# Patient Record
Sex: Female | Born: 1940 | Race: Black or African American | Hispanic: No | Marital: Single | Smoking: Former smoker
Health system: Southern US, Community
[De-identification: ages and names within clinical notes are randomized; demographics above are authoritative.]

## PROBLEM LIST (undated history)

## (undated) DIAGNOSIS — E7800 Pure hypercholesterolemia, unspecified: Secondary | ICD-10-CM

## (undated) DIAGNOSIS — I10 Essential (primary) hypertension: Secondary | ICD-10-CM

## (undated) HISTORY — PX: TUMOR REMOVAL: SHX12

## (undated) HISTORY — PX: ABDOMINAL HYSTERECTOMY: SHX81

---

## 2004-06-13 ENCOUNTER — Ambulatory Visit: Payer: Self-pay | Admitting: Family Medicine

## 2005-07-03 ENCOUNTER — Ambulatory Visit: Payer: Self-pay | Admitting: Family Medicine

## 2006-07-11 ENCOUNTER — Ambulatory Visit: Payer: Self-pay | Admitting: Family Medicine

## 2007-07-18 ENCOUNTER — Ambulatory Visit: Payer: Self-pay | Admitting: Family Medicine

## 2007-07-29 ENCOUNTER — Ambulatory Visit: Payer: Self-pay | Admitting: Family Medicine

## 2007-08-29 ENCOUNTER — Ambulatory Visit: Payer: Self-pay | Admitting: Surgery

## 2008-11-10 ENCOUNTER — Ambulatory Visit: Payer: Self-pay | Admitting: Family Medicine

## 2009-10-06 ENCOUNTER — Ambulatory Visit: Payer: Self-pay | Admitting: Cardiology

## 2009-11-15 ENCOUNTER — Ambulatory Visit: Payer: Self-pay | Admitting: Family Medicine

## 2010-11-30 ENCOUNTER — Ambulatory Visit: Payer: Self-pay | Admitting: Family Medicine

## 2011-01-23 ENCOUNTER — Ambulatory Visit: Payer: Self-pay | Admitting: Family Medicine

## 2011-11-11 ENCOUNTER — Emergency Department: Payer: Self-pay | Admitting: Unknown Physician Specialty

## 2011-12-13 ENCOUNTER — Ambulatory Visit: Payer: Self-pay | Admitting: Family Medicine

## 2012-12-16 ENCOUNTER — Ambulatory Visit: Payer: Self-pay | Admitting: Family Medicine

## 2014-01-02 ENCOUNTER — Ambulatory Visit: Payer: Self-pay | Admitting: Family Medicine

## 2014-06-19 DIAGNOSIS — E780 Pure hypercholesterolemia: Secondary | ICD-10-CM | POA: Diagnosis not present

## 2014-06-19 DIAGNOSIS — I517 Cardiomegaly: Secondary | ICD-10-CM | POA: Diagnosis not present

## 2014-06-19 DIAGNOSIS — I10 Essential (primary) hypertension: Secondary | ICD-10-CM | POA: Diagnosis not present

## 2014-06-19 DIAGNOSIS — R7309 Other abnormal glucose: Secondary | ICD-10-CM | POA: Diagnosis not present

## 2014-06-24 DIAGNOSIS — I10 Essential (primary) hypertension: Secondary | ICD-10-CM | POA: Diagnosis not present

## 2014-06-24 DIAGNOSIS — H524 Presbyopia: Secondary | ICD-10-CM | POA: Diagnosis not present

## 2014-06-24 DIAGNOSIS — R7309 Other abnormal glucose: Secondary | ICD-10-CM | POA: Diagnosis not present

## 2014-06-24 DIAGNOSIS — E780 Pure hypercholesterolemia: Secondary | ICD-10-CM | POA: Diagnosis not present

## 2014-06-24 DIAGNOSIS — H5210 Myopia, unspecified eye: Secondary | ICD-10-CM | POA: Diagnosis not present

## 2014-06-29 DIAGNOSIS — I517 Cardiomegaly: Secondary | ICD-10-CM | POA: Diagnosis not present

## 2015-01-07 ENCOUNTER — Other Ambulatory Visit: Payer: Self-pay | Admitting: Family Medicine

## 2015-01-07 DIAGNOSIS — Z0000 Encounter for general adult medical examination without abnormal findings: Secondary | ICD-10-CM | POA: Diagnosis not present

## 2015-01-07 DIAGNOSIS — Z1231 Encounter for screening mammogram for malignant neoplasm of breast: Secondary | ICD-10-CM

## 2015-01-15 ENCOUNTER — Other Ambulatory Visit: Payer: Self-pay | Admitting: Family Medicine

## 2015-01-15 ENCOUNTER — Ambulatory Visit
Admission: RE | Admit: 2015-01-15 | Discharge: 2015-01-15 | Disposition: A | Discharge status: Home / Self Care | Payer: Commercial Managed Care - HMO | Source: Ambulatory Visit | Attending: Family Medicine | Admitting: Family Medicine

## 2015-01-15 DIAGNOSIS — Z1231 Encounter for screening mammogram for malignant neoplasm of breast: Secondary | ICD-10-CM | POA: Insufficient documentation

## 2015-03-22 DIAGNOSIS — Z0000 Encounter for general adult medical examination without abnormal findings: Secondary | ICD-10-CM | POA: Diagnosis not present

## 2015-03-29 DIAGNOSIS — M79671 Pain in right foot: Secondary | ICD-10-CM | POA: Diagnosis not present

## 2015-03-29 DIAGNOSIS — E7800 Pure hypercholesterolemia, unspecified: Secondary | ICD-10-CM | POA: Diagnosis not present

## 2015-03-29 DIAGNOSIS — E119 Type 2 diabetes mellitus without complications: Secondary | ICD-10-CM | POA: Diagnosis not present

## 2015-03-29 DIAGNOSIS — I10 Essential (primary) hypertension: Secondary | ICD-10-CM | POA: Diagnosis not present

## 2015-07-22 DIAGNOSIS — E7800 Pure hypercholesterolemia, unspecified: Secondary | ICD-10-CM | POA: Diagnosis not present

## 2015-07-22 DIAGNOSIS — I10 Essential (primary) hypertension: Secondary | ICD-10-CM | POA: Diagnosis not present

## 2015-07-22 DIAGNOSIS — E119 Type 2 diabetes mellitus without complications: Secondary | ICD-10-CM | POA: Diagnosis not present

## 2015-07-29 DIAGNOSIS — E7800 Pure hypercholesterolemia, unspecified: Secondary | ICD-10-CM | POA: Diagnosis not present

## 2015-07-29 DIAGNOSIS — E119 Type 2 diabetes mellitus without complications: Secondary | ICD-10-CM | POA: Diagnosis not present

## 2015-07-29 DIAGNOSIS — I10 Essential (primary) hypertension: Secondary | ICD-10-CM | POA: Diagnosis not present

## 2015-08-23 DIAGNOSIS — H524 Presbyopia: Secondary | ICD-10-CM | POA: Diagnosis not present

## 2015-08-23 DIAGNOSIS — H5210 Myopia, unspecified eye: Secondary | ICD-10-CM | POA: Diagnosis not present

## 2015-10-21 DIAGNOSIS — E7800 Pure hypercholesterolemia, unspecified: Secondary | ICD-10-CM | POA: Diagnosis not present

## 2015-10-21 DIAGNOSIS — E119 Type 2 diabetes mellitus without complications: Secondary | ICD-10-CM | POA: Diagnosis not present

## 2015-10-21 DIAGNOSIS — I10 Essential (primary) hypertension: Secondary | ICD-10-CM | POA: Diagnosis not present

## 2015-11-23 DIAGNOSIS — E6609 Other obesity due to excess calories: Secondary | ICD-10-CM | POA: Diagnosis not present

## 2015-11-23 DIAGNOSIS — I10 Essential (primary) hypertension: Secondary | ICD-10-CM | POA: Diagnosis not present

## 2015-11-23 DIAGNOSIS — E7800 Pure hypercholesterolemia, unspecified: Secondary | ICD-10-CM | POA: Diagnosis not present

## 2015-11-23 DIAGNOSIS — E119 Type 2 diabetes mellitus without complications: Secondary | ICD-10-CM | POA: Diagnosis not present

## 2016-02-08 ENCOUNTER — Other Ambulatory Visit: Payer: Self-pay | Admitting: Family Medicine

## 2016-02-08 DIAGNOSIS — Z1231 Encounter for screening mammogram for malignant neoplasm of breast: Secondary | ICD-10-CM

## 2016-03-15 DIAGNOSIS — E7800 Pure hypercholesterolemia, unspecified: Secondary | ICD-10-CM | POA: Diagnosis not present

## 2016-03-15 DIAGNOSIS — E119 Type 2 diabetes mellitus without complications: Secondary | ICD-10-CM | POA: Diagnosis not present

## 2016-03-15 DIAGNOSIS — I10 Essential (primary) hypertension: Secondary | ICD-10-CM | POA: Diagnosis not present

## 2016-03-22 ENCOUNTER — Ambulatory Visit
Admission: RE | Admit: 2016-03-22 | Discharge: 2016-03-22 | Disposition: A | Discharge status: Home / Self Care | Payer: Commercial Managed Care - HMO | Source: Ambulatory Visit | Attending: Family Medicine | Admitting: Family Medicine

## 2016-03-22 DIAGNOSIS — Z1231 Encounter for screening mammogram for malignant neoplasm of breast: Secondary | ICD-10-CM | POA: Insufficient documentation

## 2016-03-22 DIAGNOSIS — E119 Type 2 diabetes mellitus without complications: Secondary | ICD-10-CM | POA: Diagnosis not present

## 2016-03-22 DIAGNOSIS — Z0000 Encounter for general adult medical examination without abnormal findings: Secondary | ICD-10-CM | POA: Diagnosis not present

## 2016-03-22 DIAGNOSIS — E7800 Pure hypercholesterolemia, unspecified: Secondary | ICD-10-CM | POA: Diagnosis not present

## 2016-03-22 DIAGNOSIS — Z23 Encounter for immunization: Secondary | ICD-10-CM | POA: Diagnosis not present

## 2016-03-22 DIAGNOSIS — R001 Bradycardia, unspecified: Secondary | ICD-10-CM | POA: Diagnosis not present

## 2016-03-22 DIAGNOSIS — I10 Essential (primary) hypertension: Secondary | ICD-10-CM | POA: Diagnosis not present

## 2016-03-22 DIAGNOSIS — E6609 Other obesity due to excess calories: Secondary | ICD-10-CM | POA: Diagnosis not present

## 2016-07-20 DIAGNOSIS — E6609 Other obesity due to excess calories: Secondary | ICD-10-CM | POA: Diagnosis not present

## 2016-07-20 DIAGNOSIS — E7800 Pure hypercholesterolemia, unspecified: Secondary | ICD-10-CM | POA: Diagnosis not present

## 2016-07-20 DIAGNOSIS — I10 Essential (primary) hypertension: Secondary | ICD-10-CM | POA: Diagnosis not present

## 2016-07-20 DIAGNOSIS — E119 Type 2 diabetes mellitus without complications: Secondary | ICD-10-CM | POA: Diagnosis not present

## 2016-07-24 DIAGNOSIS — I10 Essential (primary) hypertension: Secondary | ICD-10-CM | POA: Diagnosis not present

## 2016-07-24 DIAGNOSIS — E119 Type 2 diabetes mellitus without complications: Secondary | ICD-10-CM | POA: Diagnosis not present

## 2016-07-24 DIAGNOSIS — E7800 Pure hypercholesterolemia, unspecified: Secondary | ICD-10-CM | POA: Diagnosis not present

## 2016-11-23 DIAGNOSIS — E7800 Pure hypercholesterolemia, unspecified: Secondary | ICD-10-CM | POA: Diagnosis not present

## 2016-11-23 DIAGNOSIS — E119 Type 2 diabetes mellitus without complications: Secondary | ICD-10-CM | POA: Diagnosis not present

## 2016-11-23 DIAGNOSIS — E6609 Other obesity due to excess calories: Secondary | ICD-10-CM | POA: Diagnosis not present

## 2016-11-23 DIAGNOSIS — I10 Essential (primary) hypertension: Secondary | ICD-10-CM | POA: Diagnosis not present

## 2016-11-27 ENCOUNTER — Emergency Department
Admission: EM | Admit: 2016-11-27 | Discharge: 2016-11-27 | Disposition: A | Discharge status: Home / Self Care | Payer: Medicare HMO | Attending: Emergency Medicine | Admitting: Emergency Medicine

## 2016-11-27 ENCOUNTER — Emergency Department: Payer: Medicare HMO

## 2016-11-27 ENCOUNTER — Encounter: Payer: Self-pay | Admitting: Emergency Medicine

## 2016-11-27 DIAGNOSIS — Z87891 Personal history of nicotine dependence: Secondary | ICD-10-CM | POA: Diagnosis not present

## 2016-11-27 DIAGNOSIS — I10 Essential (primary) hypertension: Secondary | ICD-10-CM | POA: Diagnosis not present

## 2016-11-27 DIAGNOSIS — M25562 Pain in left knee: Secondary | ICD-10-CM | POA: Diagnosis not present

## 2016-11-27 DIAGNOSIS — Z7982 Long term (current) use of aspirin: Secondary | ICD-10-CM | POA: Diagnosis not present

## 2016-11-27 DIAGNOSIS — Z79899 Other long term (current) drug therapy: Secondary | ICD-10-CM | POA: Insufficient documentation

## 2016-11-27 DIAGNOSIS — M1712 Unilateral primary osteoarthritis, left knee: Secondary | ICD-10-CM | POA: Insufficient documentation

## 2016-11-27 HISTORY — DX: Essential (primary) hypertension: I10

## 2016-11-27 HISTORY — DX: Pure hypercholesterolemia, unspecified: E78.00

## 2016-11-27 MED ORDER — TRAMADOL HCL 50 MG PO TABS: 50.0000 mg | ORAL_TABLET | Freq: Three times a day (TID) | ORAL | 0 refills | Status: AC | PRN

## 2016-11-27 MED ORDER — TRAMADOL HCL 50 MG PO TABS
50.0000 mg | ORAL_TABLET | Freq: Once | ORAL | Status: AC
  Administered 2016-11-27: 50 mg via ORAL
  Filled 2016-11-27: qty 1

## 2016-11-27 NOTE — ED Notes (Signed)
See triage note  States she felt a pop to left this am   States she twisted her knee   Having pain to lateral aspect on knee  Min swelling

## 2016-11-27 NOTE — ED Provider Notes (Signed)
[facility] Emergency Department Provider Note  ____________________________________________   First MD Initiated Contact with Patient 11/27/16 [phone]     (approximate)  I have reviewed the triage vital signs and the nursing notes.   HISTORY  Chief Complaint Knee Pain   HPI Danielle Hunt is a 76 y.o. female is here with complaint of left knee pain. Patient states she was locking her door this morning when she turned and felt her knee pop. She states she's been told in the past that she had arthritis. She takes no arthritis medication. She frequently wears a elastic knee brace. She has not been seen by orthopedics for her knee problems. Currently she rates her pain as 8 out of 10.   Past Medical History:  Diagnosis Date  . Hypercholesteremia   . Hypertension     There are no active problems to display for this patient.   Past Surgical History:  Procedure Laterality Date  . ABDOMINAL HYSTERECTOMY    . TUMOR REMOVAL      Prior to Admission medications   Medication Sig Start Date End Date Taking? Authorizing Provider  aspirin EC 81 MG tablet Take 81 mg by mouth daily.   Yes [provider]  atorvastatin (LIPITOR) 40 MG tablet Take 40 mg by mouth daily.   Yes [provider]  hydrochlorothiazide (HYDRODIURIL) 25 MG tablet Take 25 mg by mouth daily.   Yes [provider]  losartan (COZAAR) 100 MG tablet Take 100 mg by mouth daily.   Yes [provider]  traMADol (ULTRAM) 50 MG tablet Take 1 tablet (50 mg total) by mouth every 8 (eight) hours as needed. 11/27/16   Tommi Rumps, PA-C    Allergies Patient has no known allergies.  History reviewed. No pertinent family history.  Social History Social History  Substance Use Topics  . Smoking status: Former Games developer  . Smokeless tobacco: Never Used  . Alcohol use No    Review of Systems Constitutional: No fever/chills Cardiovascular: Denies chest  pain. Respiratory: Denies shortness of breath. Gastrointestinal: No abdominal pain.  No nausea, no vomiting.  Musculoskeletal: Positive left knee pain. Skin: Negative for rash. Neurological: Negative for headaches, focal weakness or numbness. ____________________________________________   PHYSICAL EXAM:  VITAL SIGNS: ED Triage Vitals  Enc Vitals Group     BP 11/27/16 0843 114/68     Pulse Rate 11/27/16 0843 62     Resp 11/27/16 0843 18     Temp 11/27/16 0843 98.4 F (36.9 C)     Temp Source 11/27/16 0843 Oral     SpO2 11/27/16 0843 100 %     Weight 11/27/16 0837 203 lb (92.1 kg)     Height 11/27/16 0837 5\' 3"  (1.6 m)     Head Circumference --      Peak Flow --      Pain Score 11/27/16 0837 8     Pain Loc --      Pain Edu? --      Excl. in GC? --    Constitutional: Alert and oriented. Well appearing and in no acute distress. Eyes: Conjunctivae are normal.  Head: Atraumatic. Neck: No stridor.   Cardiovascular: Normal rate, regular rhythm. Grossly normal heart sounds.  Good peripheral circulation. Respiratory: Normal respiratory effort.  No retractions. Lungs CTAB. Musculoskeletal: Left knee without gross deformity however there is arthritic changes noted. Crepitus with range of motion. There is generalized tenderness on palpation. No erythema, ecchymosis or abrasions were noted.  Neurologic:  Normal speech and language. No gross focal neurologic deficits are appreciated.  Skin:  Skin is warm, dry and intact. No rash noted. Psychiatric: Mood and affect are normal. Speech and behavior are normal.  ____________________________________________   LABS (all labs ordered are listed, but only abnormal results are displayed)  Labs Reviewed - No data to display   RADIOLOGY  Dg Knee Complete 4 Views Left  Result Date: 11/27/2016 CLINICAL DATA:  Left knee pain. EXAM: LEFT KNEE - COMPLETE 4+ VIEW COMPARISON:  11/11/2011 FINDINGS: No joint effusion. Moderate tricompartment joint  space narrowing is identified. There is also try compartment marginal spur formation. Mild chondrocalcinosis noted. IMPRESSION: 1. No acute findings. 2. Osteoarthritis with chondrocalcinosis. Electronically Signed   By: Signa Kell M.D.   On: 11/27/2016 09:10    ____________________________________________   PROCEDURES  Procedure(s) performed: None  Procedures  Critical Care performed: No  ____________________________________________   INITIAL IMPRESSION / ASSESSMENT AND PLAN / ED COURSE  Pertinent labs & imaging results that were available during my care of the patient were reviewed by me and considered in my medical decision making (see chart for details).  Patient was placed in knee immobilizer as she felt her knee was unstable. She was also given a walker to use with walking. Because of her pain at this time she was given tramadol 50 mg in the ED and a prescription to continue one every 8 hours as needed. Family is aware that this medication could cause drowsiness and increase her chances of following as well as the patient. She is follow-up with her PCP if any continued problems with her knee.   ___________________________________________   FINAL CLINICAL IMPRESSION(S) / ED DIAGNOSES  Final diagnoses:  Acute pain of left knee  Osteoarthritis of left knee, unspecified osteoarthritis type      NEW MEDICATIONS STARTED DURING THIS VISIT:  Discharge Medication List as of 11/27/2016  9:50 AM    START taking these medications   Details  traMADol (ULTRAM) 50 MG tablet Take 1 tablet (50 mg total) by mouth every 8 (eight) hours as needed., Starting Mon 11/27/2016, Print         Note:  This document was prepared using Dragon voice recognition software and may include unintentional dictation errors.    Tommi Rumps, PA-C 11/27/16 1711    Merrily Brittle, MD 11/28/16 [phone]

## 2016-11-27 NOTE — ED Triage Notes (Signed)
Pt c/o left knee pain. Reports was locking door this morning and turned to quick and something in knee popped. No deformity noted.

## 2016-11-27 NOTE — Discharge Instructions (Signed)
Follow-up with your primary care doctor if any continued problems with your knee. Take tramadol 50 mg every 8 hours as needed for knee pain. Be aware that this medication could cause drowsiness increase your risk for falling. Wear knee immobilizer for added support at this time. When walking use the  walker for extra support.

## 2016-12-08 DIAGNOSIS — M170 Bilateral primary osteoarthritis of knee: Secondary | ICD-10-CM | POA: Diagnosis not present

## 2017-01-03 DIAGNOSIS — M25561 Pain in right knee: Secondary | ICD-10-CM | POA: Diagnosis not present

## 2017-01-03 DIAGNOSIS — M25562 Pain in left knee: Secondary | ICD-10-CM | POA: Diagnosis not present

## 2017-01-03 DIAGNOSIS — M170 Bilateral primary osteoarthritis of knee: Secondary | ICD-10-CM | POA: Diagnosis not present

## 2017-02-13 ENCOUNTER — Other Ambulatory Visit: Payer: Self-pay | Admitting: Family Medicine

## 2017-02-13 DIAGNOSIS — Z1231 Encounter for screening mammogram for malignant neoplasm of breast: Secondary | ICD-10-CM

## 2017-02-26 DIAGNOSIS — H524 Presbyopia: Secondary | ICD-10-CM | POA: Diagnosis not present

## 2017-03-23 ENCOUNTER — Ambulatory Visit
Admission: RE | Admit: 2017-03-23 | Discharge: 2017-03-23 | Disposition: A | Discharge status: Home / Self Care | Payer: Medicare HMO | Source: Ambulatory Visit | Attending: Family Medicine | Admitting: Family Medicine

## 2017-03-23 DIAGNOSIS — Z1231 Encounter for screening mammogram for malignant neoplasm of breast: Secondary | ICD-10-CM | POA: Diagnosis not present

## 2017-04-04 DIAGNOSIS — I10 Essential (primary) hypertension: Secondary | ICD-10-CM | POA: Diagnosis not present

## 2017-04-04 DIAGNOSIS — E7800 Pure hypercholesterolemia, unspecified: Secondary | ICD-10-CM | POA: Diagnosis not present

## 2017-04-04 DIAGNOSIS — E6609 Other obesity due to excess calories: Secondary | ICD-10-CM | POA: Diagnosis not present

## 2017-04-04 DIAGNOSIS — E119 Type 2 diabetes mellitus without complications: Secondary | ICD-10-CM | POA: Diagnosis not present

## 2017-04-04 DIAGNOSIS — Z0000 Encounter for general adult medical examination without abnormal findings: Secondary | ICD-10-CM | POA: Diagnosis not present

## 2017-04-04 DIAGNOSIS — Z23 Encounter for immunization: Secondary | ICD-10-CM | POA: Diagnosis not present

## 2017-04-06 DIAGNOSIS — E119 Type 2 diabetes mellitus without complications: Secondary | ICD-10-CM | POA: Diagnosis not present

## 2017-04-06 DIAGNOSIS — E7800 Pure hypercholesterolemia, unspecified: Secondary | ICD-10-CM | POA: Diagnosis not present

## 2017-04-06 DIAGNOSIS — I10 Essential (primary) hypertension: Secondary | ICD-10-CM | POA: Diagnosis not present

## 2017-06-20 DIAGNOSIS — M170 Bilateral primary osteoarthritis of knee: Secondary | ICD-10-CM | POA: Diagnosis not present

## 2017-06-28 DIAGNOSIS — H401132 Primary open-angle glaucoma, bilateral, moderate stage: Secondary | ICD-10-CM | POA: Diagnosis not present

## 2017-07-30 DIAGNOSIS — E119 Type 2 diabetes mellitus without complications: Secondary | ICD-10-CM | POA: Diagnosis not present

## 2017-07-30 DIAGNOSIS — E6609 Other obesity due to excess calories: Secondary | ICD-10-CM | POA: Diagnosis not present

## 2017-07-30 DIAGNOSIS — E7800 Pure hypercholesterolemia, unspecified: Secondary | ICD-10-CM | POA: Diagnosis not present

## 2017-07-30 DIAGNOSIS — J302 Other seasonal allergic rhinitis: Secondary | ICD-10-CM | POA: Diagnosis not present

## 2017-07-30 DIAGNOSIS — I10 Essential (primary) hypertension: Secondary | ICD-10-CM | POA: Diagnosis not present

## 2017-08-02 DIAGNOSIS — E7800 Pure hypercholesterolemia, unspecified: Secondary | ICD-10-CM | POA: Diagnosis not present

## 2017-08-02 DIAGNOSIS — I10 Essential (primary) hypertension: Secondary | ICD-10-CM | POA: Diagnosis not present

## 2017-08-02 DIAGNOSIS — E119 Type 2 diabetes mellitus without complications: Secondary | ICD-10-CM | POA: Diagnosis not present

## 2017-11-29 DIAGNOSIS — E119 Type 2 diabetes mellitus without complications: Secondary | ICD-10-CM | POA: Diagnosis not present

## 2017-11-29 DIAGNOSIS — I10 Essential (primary) hypertension: Secondary | ICD-10-CM | POA: Diagnosis not present

## 2017-11-29 DIAGNOSIS — E7800 Pure hypercholesterolemia, unspecified: Secondary | ICD-10-CM | POA: Diagnosis not present

## 2017-11-29 DIAGNOSIS — E6609 Other obesity due to excess calories: Secondary | ICD-10-CM | POA: Diagnosis not present

## 2018-02-04 DIAGNOSIS — H524 Presbyopia: Secondary | ICD-10-CM | POA: Diagnosis not present

## 2018-02-27 DIAGNOSIS — Z0100 Encounter for examination of eyes and vision without abnormal findings: Secondary | ICD-10-CM | POA: Diagnosis not present

## 2018-03-13 ENCOUNTER — Other Ambulatory Visit: Payer: Self-pay | Admitting: Family Medicine

## 2018-03-13 DIAGNOSIS — Z1231 Encounter for screening mammogram for malignant neoplasm of breast: Secondary | ICD-10-CM

## 2018-04-05 ENCOUNTER — Ambulatory Visit
Admission: RE | Admit: 2018-04-05 | Discharge: 2018-04-05 | Disposition: A | Discharge status: Home / Self Care | Payer: Medicare HMO | Source: Ambulatory Visit | Attending: Family Medicine | Admitting: Family Medicine

## 2018-04-05 DIAGNOSIS — Z1231 Encounter for screening mammogram for malignant neoplasm of breast: Secondary | ICD-10-CM | POA: Diagnosis not present

## 2018-04-05 DIAGNOSIS — I10 Essential (primary) hypertension: Secondary | ICD-10-CM | POA: Diagnosis not present

## 2018-04-05 DIAGNOSIS — E119 Type 2 diabetes mellitus without complications: Secondary | ICD-10-CM | POA: Diagnosis not present

## 2018-04-05 DIAGNOSIS — E7800 Pure hypercholesterolemia, unspecified: Secondary | ICD-10-CM | POA: Diagnosis not present

## 2018-04-05 DIAGNOSIS — N183 Chronic kidney disease, stage 3 (moderate): Secondary | ICD-10-CM | POA: Diagnosis not present

## 2018-04-11 DIAGNOSIS — I10 Essential (primary) hypertension: Secondary | ICD-10-CM | POA: Diagnosis not present

## 2018-04-11 DIAGNOSIS — Z0000 Encounter for general adult medical examination without abnormal findings: Secondary | ICD-10-CM | POA: Diagnosis not present

## 2018-04-11 DIAGNOSIS — E6609 Other obesity due to excess calories: Secondary | ICD-10-CM | POA: Diagnosis not present

## 2018-04-11 DIAGNOSIS — Z8639 Personal history of other endocrine, nutritional and metabolic disease: Secondary | ICD-10-CM | POA: Diagnosis not present

## 2018-04-11 DIAGNOSIS — E7800 Pure hypercholesterolemia, unspecified: Secondary | ICD-10-CM | POA: Diagnosis not present

## 2018-04-11 DIAGNOSIS — N183 Chronic kidney disease, stage 3 (moderate): Secondary | ICD-10-CM | POA: Diagnosis not present

## 2018-04-11 DIAGNOSIS — E1121 Type 2 diabetes mellitus with diabetic nephropathy: Secondary | ICD-10-CM | POA: Diagnosis not present

## 2018-09-17 DIAGNOSIS — Z8639 Personal history of other endocrine, nutritional and metabolic disease: Secondary | ICD-10-CM | POA: Diagnosis not present

## 2018-09-17 DIAGNOSIS — E1121 Type 2 diabetes mellitus with diabetic nephropathy: Secondary | ICD-10-CM | POA: Diagnosis not present

## 2018-09-17 DIAGNOSIS — E7800 Pure hypercholesterolemia, unspecified: Secondary | ICD-10-CM | POA: Diagnosis not present

## 2018-09-17 DIAGNOSIS — N183 Chronic kidney disease, stage 3 (moderate): Secondary | ICD-10-CM | POA: Diagnosis not present

## 2018-09-17 DIAGNOSIS — I10 Essential (primary) hypertension: Secondary | ICD-10-CM | POA: Diagnosis not present

## 2018-09-19 DIAGNOSIS — E213 Hyperparathyroidism, unspecified: Secondary | ICD-10-CM | POA: Diagnosis not present

## 2018-09-24 DIAGNOSIS — Z780 Asymptomatic menopausal state: Secondary | ICD-10-CM | POA: Diagnosis not present

## 2018-09-24 DIAGNOSIS — Z8639 Personal history of other endocrine, nutritional and metabolic disease: Secondary | ICD-10-CM | POA: Diagnosis not present

## 2018-09-24 DIAGNOSIS — E7800 Pure hypercholesterolemia, unspecified: Secondary | ICD-10-CM | POA: Diagnosis not present

## 2018-09-24 DIAGNOSIS — E1121 Type 2 diabetes mellitus with diabetic nephropathy: Secondary | ICD-10-CM | POA: Diagnosis not present

## 2018-09-24 DIAGNOSIS — N183 Chronic kidney disease, stage 3 (moderate): Secondary | ICD-10-CM | POA: Diagnosis not present

## 2018-09-24 DIAGNOSIS — E6609 Other obesity due to excess calories: Secondary | ICD-10-CM | POA: Diagnosis not present

## 2018-09-24 DIAGNOSIS — I10 Essential (primary) hypertension: Secondary | ICD-10-CM | POA: Diagnosis not present

## 2018-10-18 DIAGNOSIS — H401132 Primary open-angle glaucoma, bilateral, moderate stage: Secondary | ICD-10-CM | POA: Diagnosis not present

## 2018-12-26 DIAGNOSIS — E213 Hyperparathyroidism, unspecified: Secondary | ICD-10-CM | POA: Diagnosis not present

## 2018-12-26 DIAGNOSIS — E1121 Type 2 diabetes mellitus with diabetic nephropathy: Secondary | ICD-10-CM | POA: Diagnosis not present

## 2018-12-26 DIAGNOSIS — E559 Vitamin D deficiency, unspecified: Secondary | ICD-10-CM | POA: Diagnosis not present

## 2019-02-13 DIAGNOSIS — H524 Presbyopia: Secondary | ICD-10-CM | POA: Diagnosis not present

## 2019-03-19 DIAGNOSIS — E7800 Pure hypercholesterolemia, unspecified: Secondary | ICD-10-CM | POA: Diagnosis not present

## 2019-03-19 DIAGNOSIS — N183 Chronic kidney disease, stage 3 unspecified: Secondary | ICD-10-CM | POA: Diagnosis not present

## 2019-03-19 DIAGNOSIS — I10 Essential (primary) hypertension: Secondary | ICD-10-CM | POA: Diagnosis not present

## 2019-03-26 DIAGNOSIS — I10 Essential (primary) hypertension: Secondary | ICD-10-CM | POA: Diagnosis not present

## 2019-03-26 DIAGNOSIS — Z6831 Body mass index (BMI) 31.0-31.9, adult: Secondary | ICD-10-CM | POA: Diagnosis not present

## 2019-03-26 DIAGNOSIS — E6609 Other obesity due to excess calories: Secondary | ICD-10-CM | POA: Diagnosis not present

## 2019-03-26 DIAGNOSIS — E7800 Pure hypercholesterolemia, unspecified: Secondary | ICD-10-CM | POA: Diagnosis not present

## 2019-03-26 DIAGNOSIS — Z87891 Personal history of nicotine dependence: Secondary | ICD-10-CM | POA: Diagnosis not present

## 2019-06-12 DIAGNOSIS — H401133 Primary open-angle glaucoma, bilateral, severe stage: Secondary | ICD-10-CM | POA: Diagnosis not present

## 2019-06-12 DIAGNOSIS — I10 Essential (primary) hypertension: Secondary | ICD-10-CM | POA: Diagnosis not present

## 2019-07-03 DIAGNOSIS — E559 Vitamin D deficiency, unspecified: Secondary | ICD-10-CM | POA: Diagnosis not present

## 2019-07-03 DIAGNOSIS — Z01818 Encounter for other preprocedural examination: Secondary | ICD-10-CM | POA: Diagnosis not present

## 2019-07-03 DIAGNOSIS — H401133 Primary open-angle glaucoma, bilateral, severe stage: Secondary | ICD-10-CM | POA: Diagnosis not present

## 2019-07-03 DIAGNOSIS — H2511 Age-related nuclear cataract, right eye: Secondary | ICD-10-CM | POA: Diagnosis not present

## 2019-07-03 DIAGNOSIS — E669 Obesity, unspecified: Secondary | ICD-10-CM | POA: Diagnosis not present

## 2019-07-03 DIAGNOSIS — Z0000 Encounter for general adult medical examination without abnormal findings: Secondary | ICD-10-CM | POA: Diagnosis not present

## 2019-07-03 DIAGNOSIS — E213 Hyperparathyroidism, unspecified: Secondary | ICD-10-CM | POA: Diagnosis not present

## 2019-07-03 DIAGNOSIS — I10 Essential (primary) hypertension: Secondary | ICD-10-CM | POA: Diagnosis not present

## 2019-07-03 DIAGNOSIS — Z87891 Personal history of nicotine dependence: Secondary | ICD-10-CM | POA: Diagnosis not present

## 2019-07-03 DIAGNOSIS — Z6831 Body mass index (BMI) 31.0-31.9, adult: Secondary | ICD-10-CM | POA: Diagnosis not present

## 2019-07-25 DIAGNOSIS — H401113 Primary open-angle glaucoma, right eye, severe stage: Secondary | ICD-10-CM | POA: Diagnosis not present

## 2019-07-25 DIAGNOSIS — H401130 Primary open-angle glaucoma, bilateral, stage unspecified: Secondary | ICD-10-CM | POA: Diagnosis not present

## 2019-07-25 DIAGNOSIS — H2511 Age-related nuclear cataract, right eye: Secondary | ICD-10-CM | POA: Diagnosis not present

## 2019-08-08 DIAGNOSIS — H409 Unspecified glaucoma: Secondary | ICD-10-CM | POA: Diagnosis not present

## 2019-08-08 DIAGNOSIS — H401133 Primary open-angle glaucoma, bilateral, severe stage: Secondary | ICD-10-CM | POA: Diagnosis not present

## 2019-08-08 DIAGNOSIS — H401123 Primary open-angle glaucoma, left eye, severe stage: Secondary | ICD-10-CM | POA: Diagnosis not present

## 2019-08-08 DIAGNOSIS — H2512 Age-related nuclear cataract, left eye: Secondary | ICD-10-CM | POA: Diagnosis not present

## 2019-10-08 DIAGNOSIS — H401133 Primary open-angle glaucoma, bilateral, severe stage: Secondary | ICD-10-CM | POA: Diagnosis not present

## 2019-12-03 DIAGNOSIS — R05 Cough: Secondary | ICD-10-CM | POA: Diagnosis not present

## 2019-12-03 DIAGNOSIS — U071 COVID-19: Secondary | ICD-10-CM | POA: Diagnosis not present

## 2019-12-03 DIAGNOSIS — Z20822 Contact with and (suspected) exposure to covid-19: Secondary | ICD-10-CM | POA: Diagnosis not present

## 2019-12-10 DIAGNOSIS — I10 Essential (primary) hypertension: Secondary | ICD-10-CM | POA: Diagnosis not present

## 2019-12-17 DIAGNOSIS — Z8616 Personal history of COVID-19: Secondary | ICD-10-CM | POA: Diagnosis not present

## 2019-12-17 DIAGNOSIS — E6609 Other obesity due to excess calories: Secondary | ICD-10-CM | POA: Diagnosis not present

## 2019-12-17 DIAGNOSIS — Z136 Encounter for screening for cardiovascular disorders: Secondary | ICD-10-CM | POA: Diagnosis not present

## 2019-12-17 DIAGNOSIS — I10 Essential (primary) hypertension: Secondary | ICD-10-CM | POA: Diagnosis not present

## 2020-01-07 DIAGNOSIS — R739 Hyperglycemia, unspecified: Secondary | ICD-10-CM | POA: Diagnosis not present

## 2020-01-07 DIAGNOSIS — E213 Hyperparathyroidism, unspecified: Secondary | ICD-10-CM | POA: Diagnosis not present

## 2020-01-07 DIAGNOSIS — E1121 Type 2 diabetes mellitus with diabetic nephropathy: Secondary | ICD-10-CM | POA: Diagnosis not present

## 2020-01-07 DIAGNOSIS — E559 Vitamin D deficiency, unspecified: Secondary | ICD-10-CM | POA: Diagnosis not present

## 2020-02-02 IMAGING — MG DIGITAL SCREENING BILATERAL MAMMOGRAM WITH TOMO AND CAD
6 of 10 series · 6 of 30 positions shown · non-contrast
Comparison: Previous exam(s).

CLINICAL DATA: Screening.

EXAM:
DIGITAL SCREENING BILATERAL MAMMOGRAM WITH TOMO AND CAD

[R MLO synth-2D (1 of 2)]
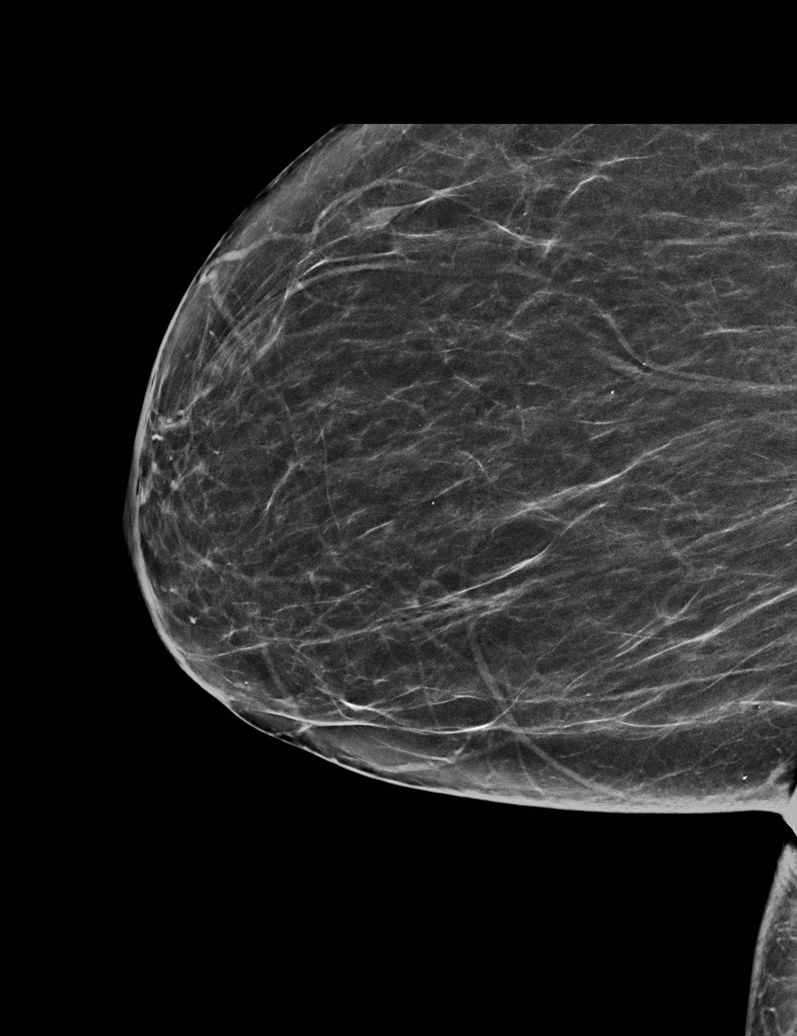

[R CC synth-2D]
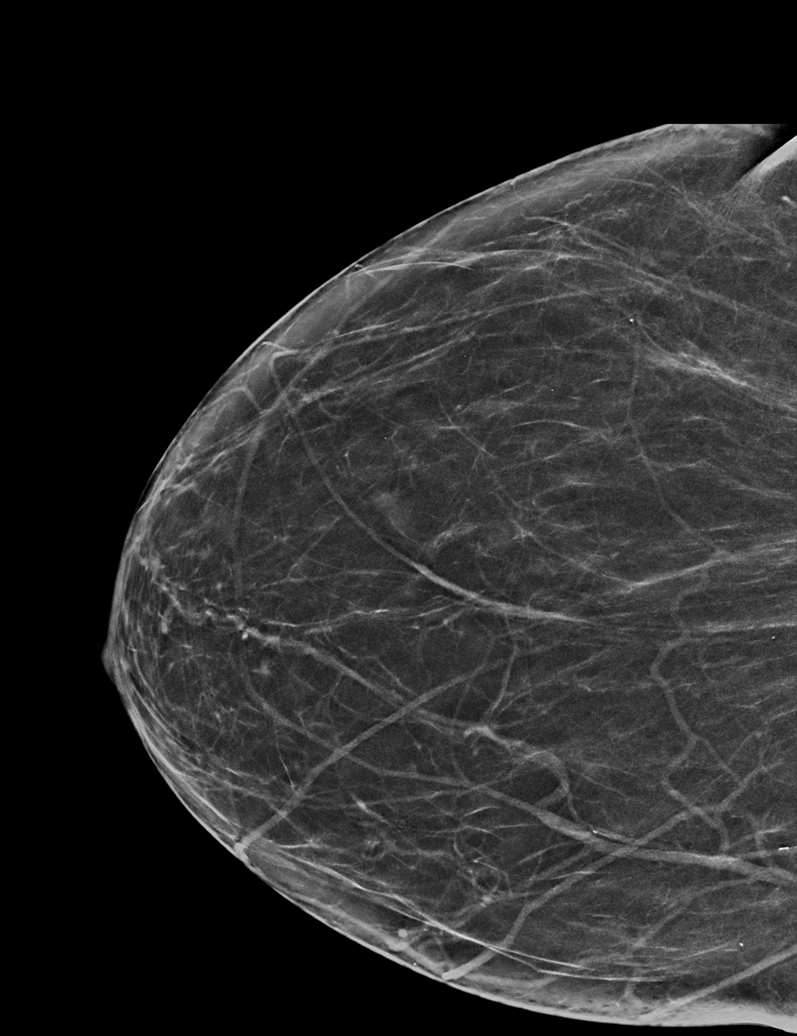

[L CC synth-2D]
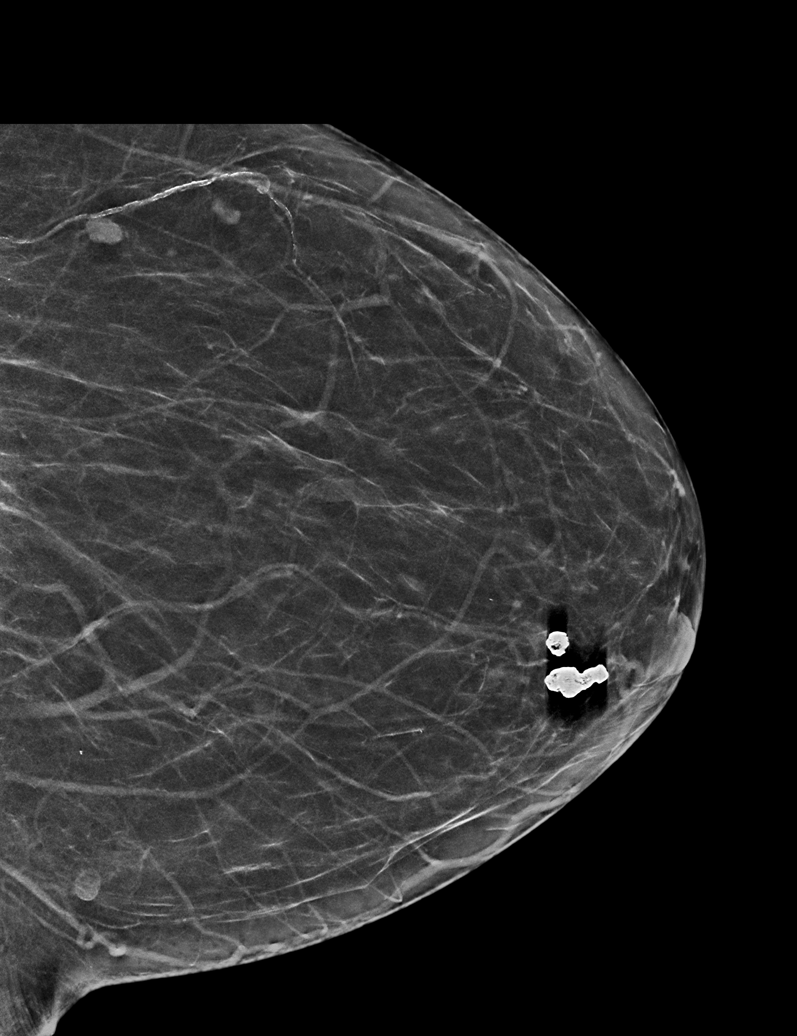

[R MLO synth-2D (2 of 2)]
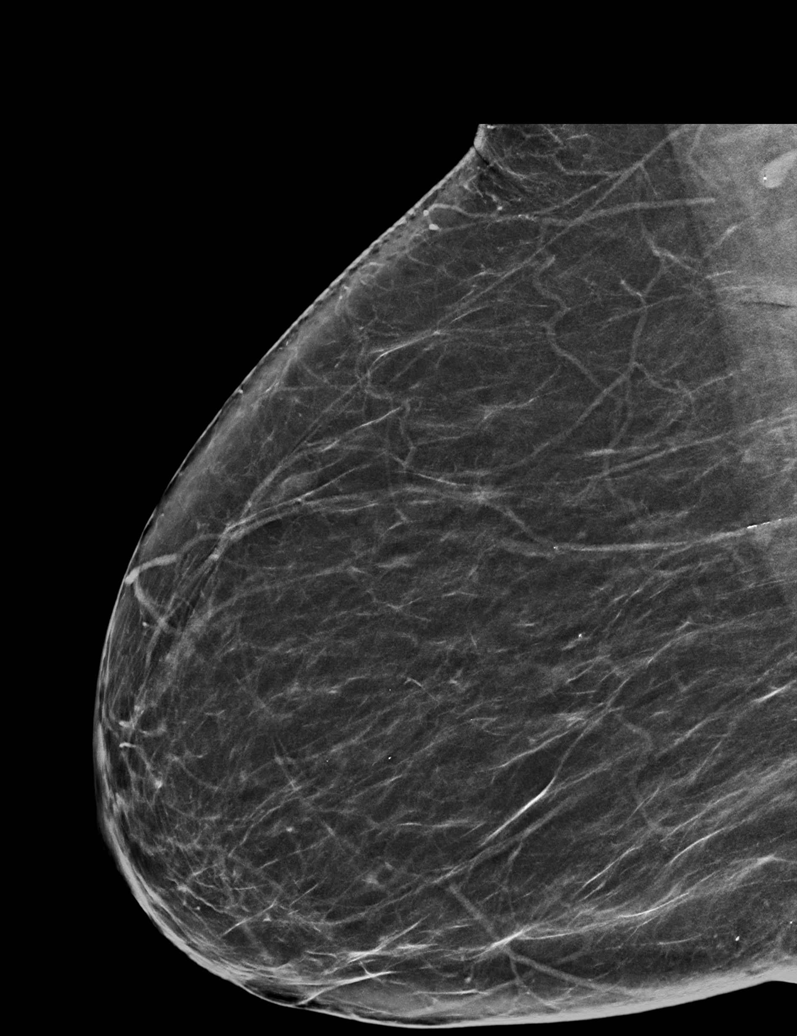

[L MLO synth-2D]
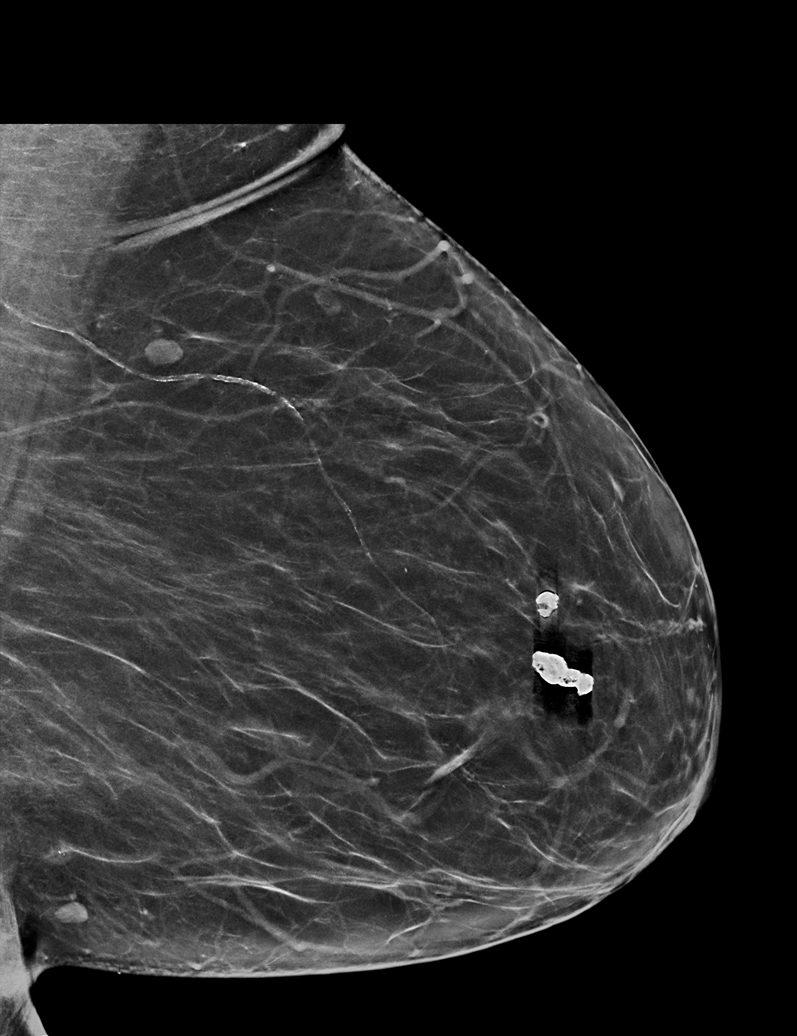

[R CC tomo · tomo slice 29/57.0]
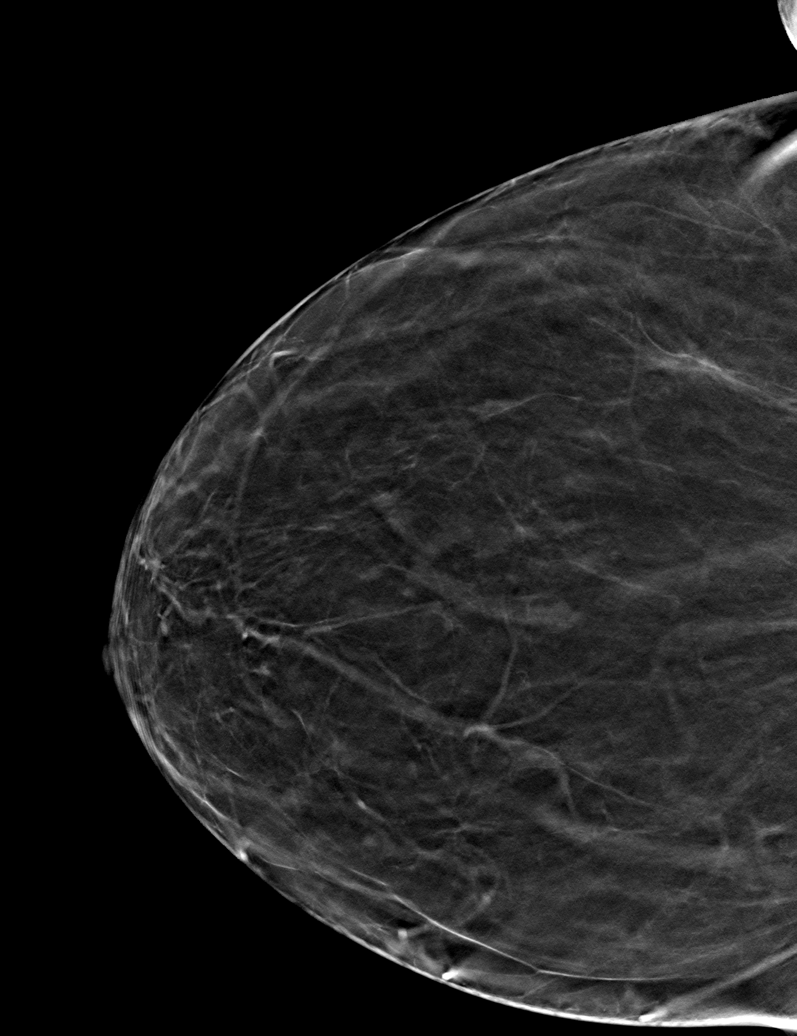

[6 of 30 positions shown; findings below may reference images not displayed]

ACR Breast Density Category b: There are scattered areas of
fibroglandular density.
FINDINGS: There are no findings suspicious for malignancy. Images were
processed with CAD.
IMPRESSION: No mammographic evidence of malignancy. A result letter of this
screening mammogram will be mailed directly to the patient.

RECOMMENDATION:
Screening mammogram in one year. (Code:[id number])

BI-RADS CATEGORY  1: Negative.

## 2020-02-13 DIAGNOSIS — E119 Type 2 diabetes mellitus without complications: Secondary | ICD-10-CM | POA: Diagnosis not present

## 2020-04-15 DIAGNOSIS — H401133 Primary open-angle glaucoma, bilateral, severe stage: Secondary | ICD-10-CM | POA: Diagnosis not present

## 2020-05-20 DIAGNOSIS — H401133 Primary open-angle glaucoma, bilateral, severe stage: Secondary | ICD-10-CM | POA: Diagnosis not present

## 2020-06-03 DIAGNOSIS — M25562 Pain in left knee: Secondary | ICD-10-CM | POA: Diagnosis not present

## 2020-06-03 DIAGNOSIS — M170 Bilateral primary osteoarthritis of knee: Secondary | ICD-10-CM | POA: Diagnosis not present

## 2020-06-03 DIAGNOSIS — M25561 Pain in right knee: Secondary | ICD-10-CM | POA: Diagnosis not present

## 2020-07-02 DIAGNOSIS — Z136 Encounter for screening for cardiovascular disorders: Secondary | ICD-10-CM | POA: Diagnosis not present

## 2020-07-02 DIAGNOSIS — I10 Essential (primary) hypertension: Secondary | ICD-10-CM | POA: Diagnosis not present

## 2020-07-05 DIAGNOSIS — R030 Elevated blood-pressure reading, without diagnosis of hypertension: Secondary | ICD-10-CM | POA: Diagnosis not present

## 2020-07-05 DIAGNOSIS — Z0000 Encounter for general adult medical examination without abnormal findings: Secondary | ICD-10-CM | POA: Diagnosis not present

## 2020-07-05 DIAGNOSIS — E669 Obesity, unspecified: Secondary | ICD-10-CM | POA: Diagnosis not present

## 2020-07-05 DIAGNOSIS — E785 Hyperlipidemia, unspecified: Secondary | ICD-10-CM | POA: Diagnosis not present

## 2020-08-26 DIAGNOSIS — H401133 Primary open-angle glaucoma, bilateral, severe stage: Secondary | ICD-10-CM | POA: Diagnosis not present

## 2020-09-30 DIAGNOSIS — E7800 Pure hypercholesterolemia, unspecified: Secondary | ICD-10-CM | POA: Diagnosis not present

## 2020-10-08 DIAGNOSIS — E6609 Other obesity due to excess calories: Secondary | ICD-10-CM | POA: Diagnosis not present

## 2020-10-08 DIAGNOSIS — E7800 Pure hypercholesterolemia, unspecified: Secondary | ICD-10-CM | POA: Diagnosis not present

## 2020-10-08 DIAGNOSIS — I10 Essential (primary) hypertension: Secondary | ICD-10-CM | POA: Diagnosis not present

## 2020-11-03 DIAGNOSIS — M8588 Other specified disorders of bone density and structure, other site: Secondary | ICD-10-CM | POA: Diagnosis not present

## 2020-11-03 DIAGNOSIS — E559 Vitamin D deficiency, unspecified: Secondary | ICD-10-CM | POA: Diagnosis not present

## 2020-11-03 DIAGNOSIS — E213 Hyperparathyroidism, unspecified: Secondary | ICD-10-CM | POA: Diagnosis not present

## 2020-11-10 DIAGNOSIS — E559 Vitamin D deficiency, unspecified: Secondary | ICD-10-CM | POA: Diagnosis not present

## 2020-11-10 DIAGNOSIS — E213 Hyperparathyroidism, unspecified: Secondary | ICD-10-CM | POA: Diagnosis not present

## 2020-12-17 DIAGNOSIS — H401133 Primary open-angle glaucoma, bilateral, severe stage: Secondary | ICD-10-CM | POA: Diagnosis not present

## 2021-02-14 DIAGNOSIS — I10 Essential (primary) hypertension: Secondary | ICD-10-CM | POA: Diagnosis not present

## 2021-02-14 DIAGNOSIS — M170 Bilateral primary osteoarthritis of knee: Secondary | ICD-10-CM | POA: Diagnosis not present

## 2021-02-28 DIAGNOSIS — H524 Presbyopia: Secondary | ICD-10-CM | POA: Diagnosis not present

## 2023-07-03 DIAGNOSIS — D75839 Thrombocytosis, unspecified: Secondary | ICD-10-CM | POA: Diagnosis not present

## 2023-07-03 DIAGNOSIS — E7800 Pure hypercholesterolemia, unspecified: Secondary | ICD-10-CM | POA: Diagnosis not present

## 2023-07-03 DIAGNOSIS — R7303 Prediabetes: Secondary | ICD-10-CM | POA: Diagnosis not present

## 2023-07-03 DIAGNOSIS — G309 Alzheimer's disease, unspecified: Secondary | ICD-10-CM | POA: Diagnosis not present

## 2023-07-03 DIAGNOSIS — J302 Other seasonal allergic rhinitis: Secondary | ICD-10-CM | POA: Diagnosis not present

## 2023-07-03 DIAGNOSIS — Z0000 Encounter for general adult medical examination without abnormal findings: Secondary | ICD-10-CM | POA: Diagnosis not present

## 2023-07-03 DIAGNOSIS — I10 Essential (primary) hypertension: Secondary | ICD-10-CM | POA: Diagnosis not present

## 2023-07-03 DIAGNOSIS — Z8639 Personal history of other endocrine, nutritional and metabolic disease: Secondary | ICD-10-CM | POA: Diagnosis not present

## 2023-07-03 DIAGNOSIS — F0150 Vascular dementia without behavioral disturbance: Secondary | ICD-10-CM | POA: Diagnosis not present

## 2023-07-03 DIAGNOSIS — F0280 Dementia in other diseases classified elsewhere without behavioral disturbance: Secondary | ICD-10-CM | POA: Diagnosis not present

## 2023-07-17 ENCOUNTER — Telehealth: Payer: Self-pay | Admitting: *Deleted

## 2023-07-17 NOTE — Telephone Encounter (Signed)
 I gave jennifer the daughter phone number [phone] name is annette.she will call daughter.

## 2023-07-19 ENCOUNTER — Encounter: Payer: Self-pay | Admitting: Family Medicine

## 2023-07-27 ENCOUNTER — Inpatient Hospital Stay: Attending: Oncology | Admitting: Oncology

## 2023-07-27 ENCOUNTER — Encounter: Payer: Self-pay | Admitting: Oncology

## 2023-07-27 ENCOUNTER — Inpatient Hospital Stay

## 2023-07-27 VITALS — BP 172/73 | HR 83 | Temp 95.0°F | Resp 18 | Wt 141.8 lb

## 2023-07-27 DIAGNOSIS — R634 Abnormal weight loss: Secondary | ICD-10-CM | POA: Insufficient documentation

## 2023-07-27 DIAGNOSIS — D751 Secondary polycythemia: Secondary | ICD-10-CM | POA: Diagnosis not present

## 2023-07-27 DIAGNOSIS — D72819 Decreased white blood cell count, unspecified: Secondary | ICD-10-CM | POA: Diagnosis not present

## 2023-07-27 DIAGNOSIS — D75839 Thrombocytosis, unspecified: Secondary | ICD-10-CM

## 2023-07-27 DIAGNOSIS — D472 Monoclonal gammopathy: Secondary | ICD-10-CM | POA: Insufficient documentation

## 2023-07-27 DIAGNOSIS — D471 Chronic myeloproliferative disease: Secondary | ICD-10-CM | POA: Insufficient documentation

## 2023-07-27 LAB — COMPREHENSIVE METABOLIC PANEL WITH GFR
ALT: 9 U/L (ref 0–44)
AST: 14 U/L — ABNORMAL LOW (ref 15–41)
Albumin: 3.7 g/dL (ref 3.5–5.0)
Alkaline Phosphatase: 61 U/L (ref 38–126)
Anion gap: 6 (ref 5–15)
BUN: 9 mg/dL (ref 8–23)
CO2: 21 mmol/L — ABNORMAL LOW (ref 22–32)
Calcium: 10.9 mg/dL — ABNORMAL HIGH (ref 8.9–10.3)
Chloride: 113 mmol/L — ABNORMAL HIGH (ref 98–111)
Creatinine, Ser: 0.83 mg/dL (ref 0.44–1.00)
GFR, Estimated: >60 mL/min (ref >60)
Glucose, Bld: 100 mg/dL — ABNORMAL HIGH (ref 70–99)
Potassium: 3.3 mmol/L — ABNORMAL LOW (ref 3.5–5.1)
Sodium: 140 mmol/L (ref 135–145)
Total Bilirubin: 1.2 mg/dL (ref 0.0–1.2)
Total Protein: 7.1 g/dL (ref 6.5–8.1)

## 2023-07-27 LAB — CBC WITH DIFFERENTIAL/PLATELET
Abs Immature Granulocytes: 0.01 10*3/uL (ref 0.00–0.07)
Basophils Absolute: 0.1 10*3/uL (ref 0.0–0.1)
Basophils Relative: 2 %
Eosinophils Absolute: 0.2 10*3/uL (ref 0.0–0.5)
Eosinophils Relative: 5 %
HCT: 46.3 % — ABNORMAL HIGH (ref 36.0–46.0)
Hemoglobin: 14.9 g/dL (ref 12.0–15.0)
Immature Granulocytes: 0 %
Lymphocytes Relative: 25 %
Lymphs Abs: 1 10*3/uL (ref 0.7–4.0)
MCH: 27.9 pg (ref 26.0–34.0)
MCHC: 32.2 g/dL (ref 30.0–36.0)
MCV: 86.7 fL (ref 80.0–100.0)
Monocytes Absolute: 0.4 10*3/uL (ref 0.1–1.0)
Monocytes Relative: 10 %
Neutro Abs: 2.2 10*3/uL (ref 1.7–7.7)
Neutrophils Relative %: 58 %
Platelets: 506 10*3/uL — ABNORMAL HIGH (ref 150–400)
RBC: 5.34 MIL/uL — ABNORMAL HIGH (ref 3.87–5.11)
RDW: 17.2 % — ABNORMAL HIGH (ref 11.5–15.5)
WBC: 3.9 10*3/uL — ABNORMAL LOW (ref 4.0–10.5)
nRBC: 0 % (ref 0.0–0.2)

## 2023-07-27 LAB — TECHNOLOGIST SMEAR REVIEW: Plt Morphology: INCREASED

## 2023-07-27 LAB — HEPATITIS PANEL, ACUTE
HCV Ab: NONREACTIVE
Hep A IgM: NONREACTIVE
Hep B C IgM: NONREACTIVE
Hepatitis B Surface Ag: NONREACTIVE

## 2023-07-27 LAB — TSH: TSH: 1.311 u[IU]/mL (ref 0.350–4.500)

## 2023-07-27 LAB — LACTATE DEHYDROGENASE: LDH: 142 U/L (ref 98–192)

## 2023-07-27 LAB — FOLATE: Folate: 6.8 ng/mL (ref >5.9)

## 2023-07-27 LAB — HIV ANTIBODY (ROUTINE TESTING W REFLEX): HIV Screen 4th Generation wRfx: NONREACTIVE

## 2023-07-27 LAB — VITAMIN B12: Vitamin B-12: 259 pg/mL (ref 180–914)

## 2023-07-27 NOTE — Assessment & Plan Note (Signed)
 Previous labs were reviewed with the patient. Check CBC, CMP, protein electrophoresis, light chain ratio, flow cytometry, LDH, folate, B12, hepatitis, HIV.

## 2023-07-27 NOTE — Assessment & Plan Note (Signed)
 Check protein electrophoresis, light chain ratio.

## 2023-07-27 NOTE — Assessment & Plan Note (Signed)
 Pending above work up

## 2023-07-27 NOTE — Progress Notes (Signed)
 Hematology/Oncology Consult note [phone] [phone]        REFERRING PROVIDER: Monique Ano, MD   CHIEF COMPLAINTS/REASON FOR VISIT:  Evaluation of leukopenia   ASSESSMENT & PLAN:   Leukopenia Previous labs were reviewed with the patient. Check CBC, CMP, protein electrophoresis, light chain ratio, flow cytometry, LDH, folate, B12, hepatitis, HIV.  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 Check JAK2 V617F mutation with reflex to other mutations.  Check BCR-ABL 1, erythropoietin  level.  Hypercalcemia Check protein electrophoresis, light chain ratio.  Thrombocytosis Pending above workup  Weight loss Check TSH   Orders Placed This Encounter  Procedures   CBC with Differential/Platelet    Standing Status:   Future    Number of Occurrences:   1    Expected Date:   07/27/2023    Expiration Date:   07/26/2024   Multiple Myeloma Panel (SPEP&IFE w/QIG)    Standing Status:   Future    Number of Occurrences:   1    Expected Date:   07/27/2023    Expiration Date:   07/26/2024   Kappa/lambda light chains    Standing Status:   Future    Number of Occurrences:   1    Expected Date:   07/27/2023    Expiration Date:   07/26/2024   Flow cytometry panel-leukemia/lymphoma work-up    Standing Status:   Future    Number of Occurrences:   1    Expected Date:   07/27/2023    Expiration Date:   07/26/2024   Lactate dehydrogenase    Standing Status:   Future    Number of Occurrences:   1    Expected Date:   07/27/2023    Expiration Date:   07/26/2024   BCR-ABL1 FISH    Standing Status:   Future    Number of Occurrences:   1    Expected Date:   07/27/2023    Expiration Date:   07/26/2024   Vitamin B12    Standing Status:   Future    Number of Occurrences:   1    Expected Date:   07/27/2023    Expiration Date:   07/26/2024   Folate    Standing Status:   Future    Number of Occurrences:   1    Expected Date:   07/27/2023    Expiration Date:   07/26/2024    Comprehensive metabolic panel with GFR    Standing Status:   Future    Number of Occurrences:   1    Expected Date:   07/27/2023    Expiration Date:   07/26/2024   TSH    Standing Status:   Future    Number of Occurrences:   1    Expected Date:   07/27/2023    Expiration Date:   07/26/2024   JAK2 V617F rfx CALR/MPL/E12-15    Standing Status:   Future    Number of Occurrences:   1    Expected Date:   07/27/2023    Expiration Date:   07/26/2024   Erythropoietin     Standing Status:   Future    Number of Occurrences:   1    Expiration Date:   07/26/2024   Technologist smear review    Standing Status:   Future    Number of Occurrences:   1    Expected Date:   07/27/2023    Expiration Date:   07/26/2024    Clinical information::   leukopenia, thrombocytosis, erythrocytosis  HIV Antibody (routine testing w rflx)    Standing Status:   Future    Number of Occurrences:   1    Expected Date:   07/27/2023    Expiration Date:   07/26/2024   Hepatitis panel, acute    Standing Status:   Future    Number of Occurrences:   1    Expected Date:   07/27/2023    Expiration Date:   07/26/2024   Follow-up in a few weeks to review results. All questions were answered. The patient knows to call the clinic with any problems, questions or concerns.  Timmy Forbes, MD, PhD [facility] Hematology Oncology 07/27/2023   HISTORY OF PRESENTING ILLNESS:   Danielle Hunt is a  83 y.o.  female with PMH listed below was seen in consultation at the request of  Monique Ano, MD  for evaluation of leukopenia.   She has experienced significant weight loss of approximately 50 pounds over the past year, although she is currently regaining some weight. No recent illnesses such as sinus infections, pneumonia, or urinary tract infections. No night sweats, blood in the stool, or low-grade fevers. Her appetite is described as 'pretty good,' and she feels hungry at the time of the visit.  07/03/2023, CBC showed white count 4,  differential showed increased basophil percentage. Erythrocytosis with hemoglobin 15.2, platelet count 546,000.    MEDICAL HISTORY:  Past Medical History:  Diagnosis Date   Hypercholesteremia    Hypertension     SURGICAL HISTORY: Past Surgical History:  Procedure Laterality Date   ABDOMINAL HYSTERECTOMY     TUMOR REMOVAL      SOCIAL HISTORY: Social History   Socioeconomic History   Marital status: Single    Spouse name: Not on file   Number of children: Not on file   Years of education: Not on file   Highest education level: Not on file  Occupational History   Not on file  Tobacco Use   Smoking status: Former   Smokeless tobacco: Never  Substance and Sexual Activity   Alcohol use: No   Drug use: No   Sexual activity: Not on file  Other Topics Concern   Not on file  Social History Narrative   Not on file   Social Drivers of Health   Financial Resource Strain: Low Risk  (01/02/2023)   Received from [facility]   Overall Financial Resource Strain (CARDIA)    Difficulty of Paying Living Expenses: Not hard at all  Food Insecurity: No Food Insecurity (01/02/2023)   Received from [facility]   Hunger Vital Sign    Worried About Running Out of Food in the Last Year: Never true    Ran Out of Food in the Last Year: Never true  Transportation Needs: No Transportation Needs (01/02/2023)   Received from [facility]    In the past 12 months, has lack of transportation kept you from medical appointments or from getting medications?: No    Lack of Transportation (Non-Medical): No  Physical Activity: Not on file  Stress: Not on file  Social Connections: Not on file  Intimate Partner Violence: Not on file    FAMILY HISTORY: History reviewed. No pertinent family history.  ALLERGIES:  has no known allergies.  MEDICATIONS:  Current Outpatient Medications  Medication Sig Dispense Refill    amLODipine (NORVASC) 10 MG tablet Take 1 tablet by mouth daily.     aspirin  EC 81 MG tablet Take 81 mg by mouth daily.     Cholecalciferol (VITAMIN D-1000 MAX ST) 25 MCG (1000 UT) tablet Take 1,000 Units by mouth.     donepezil (ARICEPT) 5 MG tablet Take 1 tablet by mouth at bedtime.     dorzolamide-timolol (COSOPT) 2-0.5 % ophthalmic solution 1 drop 2 (two) times daily.     hydrALAZINE (APRESOLINE) 25 MG tablet 1 tab up to twice a prn if bp >160/100     hydrochlorothiazide (HYDRODIURIL) 25 MG tablet Take 25 mg by mouth daily.     losartan (COZAAR) 100 MG tablet Take 100 mg by mouth daily.     rosuvastatin (CRESTOR) 10 MG tablet Take 1 tablet by mouth at bedtime.     atorvastatin (LIPITOR) 40 MG tablet Take 40 mg by mouth daily. (Patient not taking: Reported on 07/27/2023)     traMADol  (ULTRAM ) 50 MG tablet Take 1 tablet (50 mg total) by mouth every 8 (eight) hours as needed. (Patient not taking: Reported on 07/27/2023) 15 tablet 0   No current facility-administered medications for this visit.    Review of Systems  Constitutional:  Positive for unexpected weight change. Negative for appetite change, chills, fatigue and fever.  HENT:   Negative for hearing loss and voice change.   Eyes:  Negative for eye problems.  Respiratory:  Negative for chest tightness and cough.   Cardiovascular:  Negative for chest pain.  Gastrointestinal:  Negative for abdominal distention, abdominal pain and blood in stool.  Endocrine: Negative for hot flashes.  Genitourinary:  Negative for difficulty urinating and frequency.   Musculoskeletal:  Negative for arthralgias.  Skin:  Negative for itching and rash.  Neurological:  Negative for extremity weakness.  Hematological:  Negative for adenopathy.  Psychiatric/Behavioral:  Negative for confusion.    PHYSICAL EXAMINATION: ECOG PERFORMANCE STATUS: 1 - Symptomatic but completely ambulatory Vitals:   07/27/23 1236  BP: (!) 172/73  Pulse: 83  Resp: 18  Temp:  (!) 95 F (35 C)  SpO2: 99%   Filed Weights   07/27/23 1236  Weight: 141 lb 12.8 oz (64.3 kg)    Physical Exam Constitutional:      General: She is not in acute distress. HENT:     Head: Normocephalic and atraumatic.  Eyes:     General: No scleral icterus. Cardiovascular:     Rate and Rhythm: Normal rate and regular rhythm.     Heart sounds: Normal heart sounds.  Pulmonary:     Effort: Pulmonary effort is normal. No respiratory distress.     Breath sounds: Normal breath sounds. No wheezing.  Abdominal:     General: Bowel sounds are normal. There is no distension.     Palpations: Abdomen is soft.  Musculoskeletal:        General: No deformity. Normal range of motion.     Cervical back: Normal range of motion and neck supple.  Skin:    General: Skin is warm and dry.     Findings: No erythema or rash.  Neurological:     Mental Status: She is alert and oriented to person, place, and time. Mental status is at baseline.  Psychiatric:        Mood and Affect: Mood normal.     LABORATORY DATA:  I have reviewed the data as listed    Latest Ref Rng & Units 07/27/2023    1:08 PM  CBC  WBC 4.0 - 10.5 K/uL 3.9   Hemoglobin 12.0 - 15.0  g/dL 16.1   Hematocrit 09.6 - 46.0 % 46.3   Platelets 150 - 400 K/uL 506       Latest Ref Rng & Units 07/27/2023    1:08 PM  CMP  Glucose 70 - 99 mg/dL 045   BUN 8 - 23 mg/dL 9   Creatinine 4.09 - 8.11 mg/dL 9.14   Sodium 782 - 956 mmol/L 140   Potassium 3.5 - 5.1 mmol/L 3.3   Chloride 98 - 111 mmol/L 113   CO2 22 - 32 mmol/L 21   Calcium 8.9 - 10.3 mg/dL 21.3   Total Protein 6.5 - 8.1 g/dL 7.1   Total Bilirubin 0.0 - 1.2 mg/dL 1.2   Alkaline Phos 38 - 126 U/L 61   AST 15 - 41 U/L 14   ALT 0 - 44 U/L 9       RADIOGRAPHIC STUDIES: I have personally reviewed the radiological images as listed and agreed with the findings in the report. No results found.

## 2023-07-27 NOTE — Assessment & Plan Note (Signed)
 Primary versus secondary. Check JAK2 V617F mutation with reflex to other mutations.  Check BCR-ABL 1, erythropoietin  level.

## 2023-07-27 NOTE — Assessment & Plan Note (Signed)
 Check TSH

## 2023-07-28 LAB — ERYTHROPOIETIN: Erythropoietin: 2.1 m[IU]/mL — ABNORMAL LOW (ref 2.6–18.5)

## 2023-07-30 LAB — KAPPA/LAMBDA LIGHT CHAINS
Kappa free light chain: 55.8 mg/L — ABNORMAL HIGH (ref 3.3–19.4)
Kappa, lambda light chain ratio: 5.31 — ABNORMAL HIGH (ref 0.26–1.65)
Lambda free light chains: 10.5 mg/L (ref 5.7–26.3)

## 2023-07-31 LAB — MULTIPLE MYELOMA PANEL, SERUM
Albumin SerPl Elph-Mcnc: 3.7 g/dL (ref 2.9–4.4)
Albumin/Glob SerPl: 1.2 (ref 0.7–1.7)
Alpha 1: 0.3 g/dL (ref 0.0–0.4)
Alpha2 Glob SerPl Elph-Mcnc: 0.6 g/dL (ref 0.4–1.0)
B-Globulin SerPl Elph-Mcnc: 1.3 g/dL (ref 0.7–1.3)
Gamma Glob SerPl Elph-Mcnc: 1.1 g/dL (ref 0.4–1.8)
Globulin, Total: 3.3 g/dL (ref 2.2–3.9)
IgA: 449 mg/dL — ABNORMAL HIGH (ref 64–422)
IgG (Immunoglobin G), Serum: 1264 mg/dL (ref 586–1602)
IgM (Immunoglobulin M), Srm: 108 mg/dL (ref 26–217)
M Protein SerPl Elph-Mcnc: 0.6 g/dL — ABNORMAL HIGH
Total Protein ELP: 7 g/dL (ref 6.0–8.5)

## 2023-07-31 LAB — BCR-ABL1 FISH
Cells Analyzed: 200
Cells Counted: 200

## 2023-08-01 LAB — COMP PANEL: LEUKEMIA/LYMPHOMA

## 2023-08-03 LAB — JAK2 V617F RFX CALR/MPL/E12-15: JAK2 V617F %: 8.17 %

## 2023-08-27 ENCOUNTER — Telehealth: Payer: Self-pay | Admitting: *Deleted

## 2023-08-27 ENCOUNTER — Inpatient Hospital Stay: Attending: Oncology | Admitting: Oncology

## 2023-08-27 ENCOUNTER — Encounter: Payer: Self-pay | Admitting: Oncology

## 2023-08-27 VITALS — BP 159/65 | Temp 97.6°F | Resp 18 | Wt 145.0 lb

## 2023-08-27 DIAGNOSIS — Z87891 Personal history of nicotine dependence: Secondary | ICD-10-CM | POA: Insufficient documentation

## 2023-08-27 DIAGNOSIS — D471 Chronic myeloproliferative disease: Secondary | ICD-10-CM | POA: Diagnosis not present

## 2023-08-27 DIAGNOSIS — D72819 Decreased white blood cell count, unspecified: Secondary | ICD-10-CM | POA: Diagnosis not present

## 2023-08-27 DIAGNOSIS — D472 Monoclonal gammopathy: Secondary | ICD-10-CM | POA: Diagnosis not present

## 2023-08-27 NOTE — Progress Notes (Signed)
 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 Less frequently, these patients progress to AL amyloidosis, light chain deposition disease, or another lymphoproliferative disorder. For now I recommend observation. Check SPEP and light chain ratio every 6 months.    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  Differential diagnosis include essential thrombocythemia, polycythemia vera vs myelofibrosis.  Recommend bone marrow biopsy for further evaluation.  Patient declines biopsy and also is not interested in any treatment.  Monitor   Hypercalcemia Check PTH    Orders Placed This Encounter  Procedures   CBC with Differential (Cancer Center Only)    Standing Status:   Future    Expected Date:   11/27/2023    Expiration Date:   08/26/2024   CMP (Cancer Center only)    Standing Status:   Future    Expected Date:   11/27/2023    Expiration Date:   08/26/2024   Parathyroid hormone,  intact and calcium    Standing Status:   Future    Expected Date:   11/27/2023    Expiration Date:   08/26/2024   Multiple Myeloma Panel (SPEP&IFE w/QIG)    Standing Status:   Future    Expected Date:   11/27/2023    Expiration Date:   08/26/2024   Kappa/lambda light chains    Standing Status:   Future    Expected Date:   11/27/2023    Expiration Date:   08/26/2024   Follow-up 4 months.  All questions were answered. The patient knows to call the clinic with any problems, questions or  concerns.  Timmy Forbes, MD, PhD [facility] Hematology Oncology 08/27/2023   HISTORY OF PRESENTING ILLNESS:   Danielle Hunt is a  83 y.o.  female with PMH listed below was seen in consultation at the request of  Monique Ano, MD  for evaluation of leukopenia.   She has experienced significant weight loss of approximately 50 pounds over the past year, although she is currently regaining some weight. No recent illnesses such as sinus infections, pneumonia, or urinary tract infections. No night sweats, blood in the stool, or low-grade fevers. Her appetite is described as 'pretty good,' and she feels hungry at the time of the visit.  07/03/2023, CBC showed white count 4, differential showed increased basophil percentage. Erythrocytosis with hemoglobin 15.2, platelet count 546,000.  INTERVAL HISTORY Danielle Hunt is a 83 y.o. 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  She has no new complaints. She presents to discuss results. Accompanied by daughter  INTERVAL HISTORY Danielle Hunt is a 83 y.o. female who has above history reviewed by me today presents for follow up visit for MGUS and   MEDICAL HISTORY:  Past Medical History:  Diagnosis Date   Hypercholesteremia    Hypertension     SURGICAL HISTORY: Past Surgical History:  Procedure Laterality Date   ABDOMINAL HYSTERECTOMY     TUMOR REMOVAL      SOCIAL HISTORY:  Social History   Socioeconomic History   Marital status: Single    Spouse name: Not on file   Number of children: Not on file   Years of education: Not on file   Highest education level: Not on file  Occupational History   Not on file  Tobacco Use   Smoking status: Former   Smokeless tobacco: Never  Substance and Sexual Activity   Alcohol use: No   Drug use: No   Sexual activity: Not on file  Other Topics Concern   Not on file  Social History Narrative   Not  on file   Social Drivers of Health   Financial Resource Strain: Low Risk  (01/02/2023)   Received from [facility]   Overall Financial Resource Strain (CARDIA)    Difficulty of Paying Living Expenses: Not hard at all  Food Insecurity: No Food Insecurity (01/02/2023)   Received from [facility]   Hunger Vital Sign    Worried About Running Out of Food in the Last Year: Never true    Ran Out of Food in the Last Year: Never true  Transportation Needs: No Transportation Needs (01/02/2023)   Received from [facility] - Transportation    In the past 12 months, has lack of transportation kept you from medical appointments or from getting medications?: No    Lack of Transportation (Non-Medical): No  Physical Activity: Not on file  Stress: Not on file  Social Connections: Not on file  Intimate Partner Violence: Not on file    FAMILY HISTORY: History reviewed. No pertinent family history.  ALLERGIES:  has no known allergies.  MEDICATIONS:  Current Outpatient Medications  Medication Sig Dispense Refill   amLODipine (NORVASC) 10 MG tablet Take 1 tablet by mouth daily.     aspirin EC 81 MG tablet Take 81 mg by mouth daily.     atorvastatin (LIPITOR) 40 MG tablet Take 40 mg by mouth daily.     Cholecalciferol (VITAMIN D-1000 MAX ST) 25 MCG (1000 UT) tablet Take 1,000 Units by mouth.     donepezil (ARICEPT) 5 MG tablet Take 1 tablet by mouth at bedtime.     dorzolamide-timolol (COSOPT) 2-0.5 % ophthalmic solution 1 drop 2 (two) times daily.     hydrALAZINE (APRESOLINE) 25 MG tablet 1 tab up to twice a prn if bp >160/100     hydrochlorothiazide (HYDRODIURIL) 25 MG tablet Take 25 mg by mouth daily.     losartan (COZAAR) 100 MG tablet Take 100 mg by mouth daily.     rosuvastatin (CRESTOR) 10 MG tablet Take 1 tablet by mouth at bedtime.     traMADol  (ULTRAM ) 50 MG tablet Take 1 tablet (50 mg total) by mouth every 8 (eight) hours as  needed. (Patient not taking: Reported on 08/27/2023) 15 tablet 0   No current facility-administered medications for this visit.    Review of Systems  Constitutional:  Positive for unexpected weight change. Negative for appetite change, chills, fatigue and fever.  HENT:   Negative for hearing loss and voice change.   Eyes:  Negative for eye problems.  Respiratory:  Negative for chest tightness and cough.   Cardiovascular:  Negative for chest pain.  Gastrointestinal:  Negative for abdominal distention, abdominal pain and blood in stool.  Endocrine: Negative for hot flashes.  Genitourinary:  Negative for difficulty urinating and frequency.   Musculoskeletal:  Negative for arthralgias.  Skin:  Negative for itching and rash.  Neurological:  Negative for extremity weakness.  Hematological:  Negative for adenopathy.  Psychiatric/Behavioral:  Negative for confusion.    PHYSICAL EXAMINATION: ECOG PERFORMANCE STATUS: 1 - Symptomatic but completely ambulatory Vitals:   08/27/23 1037  BP: (!) 159/65  Resp: 18  Temp: 97.6 F (36.4 C)   Filed Weights   08/27/23 1037  Weight: 144 lb 15.7 oz (65.8 kg)    Physical Exam Constitutional:      General: She is not in acute distress. HENT:     Head: Normocephalic and atraumatic.  Eyes:     General: No scleral icterus. Cardiovascular:     Rate and Rhythm: Normal rate and regular rhythm.     Heart sounds: Normal heart sounds.  Pulmonary:     Effort: Pulmonary effort is normal. No respiratory distress.     Breath sounds: Normal breath sounds. No wheezing.  Abdominal:     General: Bowel sounds are normal. There is no distension.     Palpations: Abdomen is soft.  Musculoskeletal:        General: No deformity. Normal range of motion.     Cervical back: Normal range of motion and neck supple.  Skin:    General: Skin is warm and dry.     Findings: No erythema or rash.  Neurological:     Mental Status: She is alert and oriented to person,  place, and time. Mental status is at baseline.  Psychiatric:        Mood and Affect: Mood normal.     LABORATORY DATA:  I have reviewed the data as listed    Latest Ref Rng & Units 07/27/2023    1:08 PM  CBC  WBC 4.0 - 10.5 K/uL 3.9   Hemoglobin 12.0 - 15.0 g/dL 16.1   Hematocrit 09.6 - 46.0 % 46.3   Platelets 150 - 400 K/uL 506       Latest Ref Rng & Units 07/27/2023    1:08 PM  CMP  Glucose 70 - 99 mg/dL 045   BUN 8 - 23 mg/dL 9   Creatinine 4.09 - 8.11 mg/dL 9.14   Sodium 782 - 956 mmol/L 140   Potassium 3.5 - 5.1 mmol/L 3.3   Chloride 98 - 111 mmol/L 113   CO2 22 - 32 mmol/L 21   Calcium 8.9 - 10.3 mg/dL 21.3   Total Protein 6.5 - 8.1 g/dL 7.1   Total Bilirubin 0.0 - 1.2 mg/dL 1.2   Alkaline Phos 38 - 126 U/L 61   AST 15 - 41 U/L 14   ALT 0 - 44 U/L 9       RADIOGRAPHIC STUDIES: I have personally reviewed the radiological images as listed and agreed with the findings in the report. No results found.

## 2023-08-27 NOTE — Assessment & Plan Note (Signed)
Check PTH. °

## 2023-08-27 NOTE — Assessment & Plan Note (Signed)
 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 Less frequently, these patients progress to AL amyloidosis, light chain deposition disease, or another lymphoproliferative disorder. For now I recommend observation. Check SPEP and light chain ratio every 6 months.

## 2023-08-27 NOTE — Assessment & Plan Note (Signed)
 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  Differential diagnosis include essential thrombocythemia, polycythemia vera vs myelofibrosis.  Recommend bone marrow biopsy for further evaluation.  Patient declines biopsy and also is not interested in any treatment.  Monitor

## 2023-08-27 NOTE — Telephone Encounter (Signed)
 Danielle Hunt called wanted to have some time for Dr. Wilhelmenia Harada to answer some questions that she has.  I called and that and wanted to see if I can get the questions so that I could type it up and send it to Dr. Wilhelmenia Harada but she did not answer the phone.  I did leave a message that she can call again and hopefully we can get the questions that she needs answers for

## 2023-11-27 ENCOUNTER — Telehealth: Payer: Self-pay | Admitting: Oncology

## 2023-11-27 ENCOUNTER — Inpatient Hospital Stay: Attending: Oncology

## 2023-11-27 NOTE — Telephone Encounter (Signed)
 Pt scheduled for labs today, it shows that there is a text NO to the appt for labs.  I called the number listed on chart and it goes to [employer] Left vm about the labs today and if we need to r/s before the MD appt or if we are canceling all of the appts.   Left scheduling number for her to call back and let us  know

## 2023-12-03 ENCOUNTER — Telehealth: Payer: Self-pay | Admitting: Oncology

## 2023-12-03 NOTE — Telephone Encounter (Signed)
 Per RN, can pt to r/s missed labs prior to MD appt this Friday (8/29).  Left vm for pt family member to call back to r/s labs.  **IF not able to r/s labs prior to MD appt, okayed per RN to schedule labs same day

## 2023-12-07 ENCOUNTER — Inpatient Hospital Stay: Admitting: Oncology

## 2023-12-07 ENCOUNTER — Inpatient Hospital Stay

## 2023-12-21 ENCOUNTER — Other Ambulatory Visit: Payer: Self-pay

## 2023-12-21 DIAGNOSIS — R350 Frequency of micturition: Secondary | ICD-10-CM | POA: Insufficient documentation

## 2023-12-21 DIAGNOSIS — I10 Essential (primary) hypertension: Secondary | ICD-10-CM | POA: Diagnosis not present

## 2023-12-21 DIAGNOSIS — N179 Acute kidney failure, unspecified: Secondary | ICD-10-CM | POA: Insufficient documentation

## 2023-12-21 DIAGNOSIS — R4182 Altered mental status, unspecified: Secondary | ICD-10-CM | POA: Diagnosis not present

## 2023-12-21 DIAGNOSIS — R9389 Abnormal findings on diagnostic imaging of other specified body structures: Secondary | ICD-10-CM | POA: Diagnosis not present

## 2023-12-21 DIAGNOSIS — R509 Fever, unspecified: Secondary | ICD-10-CM | POA: Insufficient documentation

## 2023-12-21 DIAGNOSIS — R052 Subacute cough: Secondary | ICD-10-CM | POA: Insufficient documentation

## 2023-12-21 DIAGNOSIS — E876 Hypokalemia: Secondary | ICD-10-CM | POA: Diagnosis not present

## 2023-12-21 LAB — CBC
HCT: 45.7 % (ref 36.0–46.0)
Hemoglobin: 14.9 g/dL (ref 12.0–15.0)
MCH: 27.6 pg (ref 26.0–34.0)
MCHC: 32.6 g/dL (ref 30.0–36.0)
MCV: 84.8 fL (ref 80.0–100.0)
Platelets: 385 K/uL (ref 150–400)
RBC: 5.39 MIL/uL — ABNORMAL HIGH (ref 3.87–5.11)
RDW: 16.1 % — ABNORMAL HIGH (ref 11.5–15.5)
WBC: 5.6 K/uL (ref 4.0–10.5)
nRBC: 0 % (ref 0.0–0.2)

## 2023-12-21 LAB — BASIC METABOLIC PANEL WITH GFR
Anion gap: 13 (ref 5–15)
BUN: 17 mg/dL (ref 8–23)
CO2: 19 mmol/L — ABNORMAL LOW (ref 22–32)
Calcium: 10.8 mg/dL — ABNORMAL HIGH (ref 8.9–10.3)
Chloride: 108 mmol/L (ref 98–111)
Creatinine, Ser: 1.04 mg/dL — ABNORMAL HIGH (ref 0.44–1.00)
GFR, Estimated: 53 mL/min — ABNORMAL LOW (ref >60)
Glucose, Bld: 136 mg/dL — ABNORMAL HIGH (ref 70–99)
Potassium: 3.1 mmol/L — ABNORMAL LOW (ref 3.5–5.1)
Sodium: 140 mmol/L (ref 135–145)

## 2023-12-21 NOTE — ED Triage Notes (Signed)
 Patient brought in by daughter for increased urinary frequency xfew days. Today when daughter came home pt diaphoretic and in a daze. Similar symptoms with previous UTI. Hx dementia.

## 2023-12-22 ENCOUNTER — Emergency Department

## 2023-12-22 ENCOUNTER — Emergency Department: Admission: EM | Admit: 2023-12-22 | Discharge: 2023-12-22 | Disposition: A | Discharge status: Home / Self Care

## 2023-12-22 DIAGNOSIS — R4182 Altered mental status, unspecified: Secondary | ICD-10-CM | POA: Diagnosis not present

## 2023-12-22 DIAGNOSIS — R509 Fever, unspecified: Secondary | ICD-10-CM | POA: Diagnosis not present

## 2023-12-22 DIAGNOSIS — R052 Subacute cough: Secondary | ICD-10-CM

## 2023-12-22 DIAGNOSIS — R9389 Abnormal findings on diagnostic imaging of other specified body structures: Secondary | ICD-10-CM

## 2023-12-22 LAB — URINALYSIS, W/ REFLEX TO CULTURE (INFECTION SUSPECTED)
Bilirubin Urine: NEGATIVE
Glucose, UA: NEGATIVE mg/dL
Ketones, ur: NEGATIVE mg/dL
Leukocytes,Ua: NEGATIVE
Nitrite: NEGATIVE
Protein, ur: 30 mg/dL — AB
Specific Gravity, Urine: 1.014 (ref 1.005–1.030)
pH: 5 (ref 5.0–8.0)

## 2023-12-22 LAB — LACTIC ACID, PLASMA: Lactic Acid, Venous: 1.3 mmol/L (ref 0.5–1.9)

## 2023-12-22 LAB — PROTIME-INR
INR: 1.2 (ref 0.8–1.2)
Prothrombin Time: 15.6 s — ABNORMAL HIGH (ref 11.4–15.2)

## 2023-12-22 LAB — RESP PANEL BY RT-PCR (RSV, FLU A&B, COVID)  RVPGX2
Influenza A by PCR: NEGATIVE
Influenza B by PCR: NEGATIVE
Resp Syncytial Virus by PCR: NEGATIVE
SARS Coronavirus 2 by RT PCR: NEGATIVE

## 2023-12-22 MED ORDER — DOXYCYCLINE HYCLATE 100 MG PO TABS: 100.0000 mg | ORAL_TABLET | Freq: Two times a day (BID) | ORAL | 0 refills | Status: AC

## 2023-12-22 MED ORDER — ACETAMINOPHEN 500 MG PO TABS
1000.0000 mg | ORAL_TABLET | Freq: Once | ORAL | Status: AC
  Administered 2023-12-22: 1000 mg via ORAL
  Filled 2023-12-22: qty 2

## 2023-12-22 MED ORDER — SODIUM CHLORIDE 0.9 % IV BOLUS
500.0000 mL | Freq: Once | INTRAVENOUS | Status: AC
  Administered 2023-12-22: 500 mL via INTRAVENOUS

## 2023-12-22 MED ORDER — CEFPODOXIME PROXETIL 200 MG PO TABS: 200.0000 mg | ORAL_TABLET | Freq: Two times a day (BID) | ORAL | 0 refills | Status: AC

## 2023-12-22 MED ORDER — SODIUM CHLORIDE 0.9 % IV SOLN
2.0000 g | Freq: Once | INTRAVENOUS | Status: AC
  Administered 2023-12-22: 2 g via INTRAVENOUS
  Filled 2023-12-22: qty 20

## 2023-12-22 MED ORDER — POTASSIUM CHLORIDE 20 MEQ PO PACK
40.0000 meq | PACK | Freq: Once | ORAL | Status: AC
  Administered 2023-12-22: 40 meq via ORAL
  Filled 2023-12-22: qty 2

## 2023-12-22 NOTE — ED Notes (Signed)
 FALL BUNDLE IN PLACE, family at the bedside, patient comfortable

## 2023-12-22 NOTE — ED Provider Notes (Signed)
 [facility] Provider Note    Event Date/Time   First MD Initiated Contact with Patient 12/22/23 0201     (approximate)   History   Urinary Frequency  Patient brought in by daughter for increased urinary frequency xfew days. Today when daughter came home pt diaphoretic and in a daze. Similar symptoms with previous UTI. Hx dementia.    HPI Danielle Hunt is a 83 y.o. female PMH myeloproliferative disease, hyperlipidemia, hypertension presents for evaluation of increased urinary frequency - Patient has had a few episodes of urinary incontinence this week.  Family member came home and found her acting somewhat strangely and incontinent of urine.  Notes this is very abnormal for her.  Fortunately mentation has returned to baseline.  No recent falls.  No cough, shortness of breath, nausea/vomiting/diarrhea.  Has otherwise been in her usual state of health.  Does have history of prior UTIs and has acted similar during those episodes.     Physical Exam   Triage Vital Signs: ED Triage Vitals [12/21/23 2305]  Encounter Vitals Group     BP (!) 141/69     Girls Systolic BP Percentile      Girls Diastolic BP Percentile      Boys Systolic BP Percentile      Boys Diastolic BP Percentile      Pulse Rate 81     Resp 18     Temp (!) 101.2 F (38.4 C)     Temp Source Oral     SpO2 97 %     Weight 135 lb (61.2 kg)     Height 5' 3 (1.6 m)     Head Circumference      Peak Flow      Pain Score 0     Pain Loc      Pain Education      Exclude from Growth Chart     Most recent vital signs: Vitals:   12/22/23 0607 12/22/23 0800  BP: (!) 146/86 (!) 154/73  Pulse: 61 63  Resp: (!) 27 (!) 22  Temp:    SpO2: 99% 98%     General: Awake, no distress.  CV:  Good peripheral perfusion. RRR, RP 2+ Resp:  Normal effort. CTAB Abd:  No distention. Nontender to deep palpation throughout Neuro:  Face symmetric, mentating at baseline per family, no focal motor deficit  appreciated   ED Results / Procedures / Treatments   Labs (all labs ordered are listed, but only abnormal results are displayed) Labs Reviewed  BASIC METABOLIC PANEL WITH GFR - Abnormal; Notable for the following components:      Result Value   Potassium 3.1 (*)    CO2 19 (*)    Glucose, Bld 136 (*)    Creatinine, Ser 1.04 (*)    Calcium 10.8 (*)    GFR, Estimated 53 (*)    All other components within normal limits  CBC - Abnormal; Notable for the following components:   RBC 5.39 (*)    RDW 16.1 (*)    All other components within normal limits  URINALYSIS, W/ REFLEX TO CULTURE (INFECTION SUSPECTED) - Abnormal; Notable for the following components:   Color, Urine YELLOW (*)    APPearance HAZY (*)    Hgb urine dipstick SMALL (*)    Protein, ur 30 (*)    Bacteria, UA RARE (*)    All other components within normal limits  PROTIME-INR - Abnormal; Notable for the following components:  Prothrombin Time 15.6 (*)    All other components within normal limits  CULTURE, BLOOD (ROUTINE X 2)  CULTURE, BLOOD (ROUTINE X 2)  RESP PANEL BY RT-PCR (RSV, FLU A&B, COVID)  RVPGX2  LACTIC ACID, PLASMA  LACTIC ACID, PLASMA     EKG  Ecg = sinus rhythm, rate 68, no gross ST elevation or depression, no significant repolarization abnormality, leftward axis, normal intervals.  No evidence of ischemia no arrhythmia on my interpretation.   RADIOLOGY Radiology interpreted by myself and radiology report reviewed.  No obvious acute pathology identified.  Possible right upper lobe adenopathy on chest x-ray.    PROCEDURES:  Critical Care performed: No  Procedures   MEDICATIONS ORDERED IN ED: Medications  cefTRIAXone  (ROCEPHIN ) 2 g in sodium chloride  0.9 % 100 mL IVPB (2 g Intravenous New Bag/Given 12/22/23 0753)  acetaminophen  (TYLENOL ) tablet 1,000 mg (1,000 mg Oral Given 12/22/23 0218)  sodium chloride  0.9 % bolus 500 mL (500 mLs Intravenous New Bag/Given 12/22/23 0240)  potassium chloride   (KLOR-CON ) packet 40 mEq (40 mEq Oral Given 12/22/23 0330)     IMPRESSION / MDM / ASSESSMENT AND PLAN / ED COURSE  I reviewed the triage vital signs and the nursing notes.                              DDX/MDM/AP: Differential diagnosis includes, but is not limited to, UTI, consider viral syndrome such as influenza or COVID-19, consider underlying pneumonia.  Do not suspect acute intra-abdominal pathology at this time.  Mentation is normalized, patient is febrile--no concern for acute intracranial pathology at this time, do suspect mild infectious encephalopathy.  Plan: - Labs - EKG -  chest x-ray - Small bolus IV fluid -Tylenol  - Reevaluate  Patient's presentation is most consistent with acute presentation with potential threat to life or bodily function.  The patient is on the cardiac monitor to evaluate for evidence of arrhythmia and/or significant heart rate changes.  ED course below.  Labs with hypokalemia, somewhat low bicarb, mild AKI overall suggestive of dehydration.  Given small bolus IV fluid here in emergency department.  Workup overall unremarkable, no clear evidence of underlying infection, so consider viral syndrome.  Patient does later note that she has been coughing for the past couple weeks, and shared decision making she would like empiric treatment for possible pneumonia, treated with ceftriaxone  and discharged with cefpodoxime  and doxycycline .  Will follow-up blood cultures as outpatient, very well-appearing here, satting well on room air, normal work of breathing and requesting discharge home.  Plan for close PMD follow-up.  ED return precautions in place.  Patient agrees with plan.  Clinical Course as of 12/22/23 9188  Sat Dec 22, 2023  0225 CBC with no leukocytosis, no anemia  BMP with mild hypokalemia, very mild bump in creatinine, otherwise unremarkable [MM]  0305 CXR: IMPRESSION: Increased soft tissue density in the right paratracheal region suspicious for  adenopathy. CT of the chest is recommended for further evaluation.   [MM]  0350 Lactate wnl [MM]  0421 Viral swab neg [MM]  0421 UA w/ no e/o infxn [MM]  0740 Patient reevaluated, notes she continues to feel very well.  No clear source of infection at this time.  Chest x-ray notes possible adenopathy but no clear pneumonia.  Patient does now endorse coughing over the past 2 weeks or so.  Fortunately no recurrence of fever, no hypoxia, no leukocytosis, normal work of breathing.  Patient prefers discharge home as opposed to observation in the hospital which I believe is reasonable, suspect possible viral syndrome though in shared decision making she would like to initiate a course of antibiotics for presumed pneumonia.  Will treat with ceftriaxone  here and discharged on Augmentin with plan for outpatient follow-up.  Blood cultures with no growth so far, patient and family will follow as outpatient, understand they may need to return if they become positive.   [MM]    Clinical Course User Index [MM] Clarine Ozell LABOR, MD     FINAL CLINICAL IMPRESSION(S) / ED DIAGNOSES   Final diagnoses:  Fever, unspecified fever cause  Subacute cough  Abnormal chest x-ray     Rx / DC Orders   ED Discharge Orders          Ordered    cefpodoxime  (VANTIN ) 200 MG tablet  2 times daily        12/22/23 0743    doxycycline  (VIBRA -TABS) 100 MG tablet  2 times daily        12/22/23 9256             Note:  This document was prepared using Dragon voice recognition software and may include unintentional dictation errors.   Clarine Ozell LABOR, MD 12/22/23 [phone]

## 2023-12-22 NOTE — Discharge Instructions (Signed)
 Your evaluation in the emergency department was overall reassuring.  I am unsure as to the exact source of your fever, but it is possible you may have an underlying pneumonia or viral syndrome.  In shared decision making, we have started you on antibiotics to treat this--please take the full course as prescribed.  As noted, there is a possible enlarged lymph node on your chest x-ray, and should discuss this further with your primary care provider--they may wish to get a CT in an outpatient setting to further evaluate this.  You will be contacted if any of your blood cultures return abnormal.  Return to the emergency department with any new or worsening symptoms.

## 2023-12-23 DIAGNOSIS — R509 Fever, unspecified: Secondary | ICD-10-CM | POA: Diagnosis not present

## 2023-12-23 DIAGNOSIS — K5900 Constipation, unspecified: Secondary | ICD-10-CM | POA: Diagnosis not present

## 2023-12-23 DIAGNOSIS — F03A0 Unspecified dementia, mild, without behavioral disturbance, psychotic disturbance, mood disturbance, and anxiety: Secondary | ICD-10-CM | POA: Diagnosis not present

## 2023-12-23 DIAGNOSIS — N390 Urinary tract infection, site not specified: Secondary | ICD-10-CM | POA: Diagnosis not present

## 2023-12-23 DIAGNOSIS — E785 Hyperlipidemia, unspecified: Secondary | ICD-10-CM | POA: Diagnosis not present

## 2023-12-23 DIAGNOSIS — Z91148 Patient's other noncompliance with medication regimen for other reason: Secondary | ICD-10-CM | POA: Diagnosis not present

## 2023-12-23 DIAGNOSIS — Z79622 Long term (current) use of janus kinase inhibitor: Secondary | ICD-10-CM | POA: Diagnosis not present

## 2023-12-23 DIAGNOSIS — H409 Unspecified glaucoma: Secondary | ICD-10-CM | POA: Diagnosis not present

## 2023-12-23 DIAGNOSIS — I2782 Chronic pulmonary embolism: Secondary | ICD-10-CM | POA: Diagnosis not present

## 2023-12-23 DIAGNOSIS — R4182 Altered mental status, unspecified: Secondary | ICD-10-CM | POA: Diagnosis not present

## 2023-12-23 DIAGNOSIS — R918 Other nonspecific abnormal finding of lung field: Secondary | ICD-10-CM | POA: Diagnosis not present

## 2023-12-23 DIAGNOSIS — R32 Unspecified urinary incontinence: Secondary | ICD-10-CM | POA: Diagnosis not present

## 2023-12-23 DIAGNOSIS — I10 Essential (primary) hypertension: Secondary | ICD-10-CM | POA: Diagnosis not present

## 2023-12-23 DIAGNOSIS — F0390 Unspecified dementia without behavioral disturbance: Secondary | ICD-10-CM | POA: Diagnosis not present

## 2023-12-23 DIAGNOSIS — I4891 Unspecified atrial fibrillation: Secondary | ICD-10-CM | POA: Diagnosis not present

## 2023-12-23 DIAGNOSIS — E039 Hypothyroidism, unspecified: Secondary | ICD-10-CM | POA: Diagnosis not present

## 2023-12-23 DIAGNOSIS — F05 Delirium due to known physiological condition: Secondary | ICD-10-CM | POA: Diagnosis not present

## 2023-12-23 DIAGNOSIS — Z1152 Encounter for screening for COVID-19: Secondary | ICD-10-CM | POA: Diagnosis not present

## 2023-12-23 DIAGNOSIS — Z79890 Hormone replacement therapy (postmenopausal): Secondary | ICD-10-CM | POA: Diagnosis not present

## 2023-12-23 DIAGNOSIS — R7989 Other specified abnormal findings of blood chemistry: Secondary | ICD-10-CM | POA: Diagnosis not present

## 2023-12-23 DIAGNOSIS — T361X6A Underdosing of cephalosporins and other beta-lactam antibiotics, initial encounter: Secondary | ICD-10-CM | POA: Diagnosis not present

## 2023-12-23 DIAGNOSIS — Z87891 Personal history of nicotine dependence: Secondary | ICD-10-CM | POA: Diagnosis not present

## 2023-12-23 DIAGNOSIS — I2489 Other forms of acute ischemic heart disease: Secondary | ICD-10-CM | POA: Diagnosis not present

## 2023-12-23 DIAGNOSIS — Z66 Do not resuscitate: Secondary | ICD-10-CM | POA: Diagnosis not present

## 2023-12-23 DIAGNOSIS — K769 Liver disease, unspecified: Secondary | ICD-10-CM | POA: Diagnosis not present

## 2023-12-23 DIAGNOSIS — G928 Other toxic encephalopathy: Secondary | ICD-10-CM | POA: Diagnosis not present

## 2023-12-23 DIAGNOSIS — R0682 Tachypnea, not elsewhere classified: Secondary | ICD-10-CM | POA: Diagnosis not present

## 2023-12-23 DIAGNOSIS — I2724 Chronic thromboembolic pulmonary hypertension: Secondary | ICD-10-CM | POA: Diagnosis not present

## 2023-12-23 DIAGNOSIS — R059 Cough, unspecified: Secondary | ICD-10-CM | POA: Diagnosis not present

## 2023-12-23 DIAGNOSIS — D471 Chronic myeloproliferative disease: Secondary | ICD-10-CM | POA: Diagnosis not present

## 2023-12-24 DIAGNOSIS — R932 Abnormal findings on diagnostic imaging of liver and biliary tract: Secondary | ICD-10-CM | POA: Diagnosis not present

## 2023-12-24 DIAGNOSIS — R7989 Other specified abnormal findings of blood chemistry: Secondary | ICD-10-CM | POA: Diagnosis not present

## 2023-12-24 DIAGNOSIS — I272 Pulmonary hypertension, unspecified: Secondary | ICD-10-CM | POA: Diagnosis not present

## 2023-12-24 DIAGNOSIS — I3481 Nonrheumatic mitral (valve) annulus calcification: Secondary | ICD-10-CM | POA: Diagnosis not present

## 2023-12-24 DIAGNOSIS — R4182 Altered mental status, unspecified: Secondary | ICD-10-CM | POA: Diagnosis not present

## 2023-12-24 DIAGNOSIS — I6782 Cerebral ischemia: Secondary | ICD-10-CM | POA: Diagnosis not present

## 2023-12-24 DIAGNOSIS — N390 Urinary tract infection, site not specified: Secondary | ICD-10-CM | POA: Diagnosis not present

## 2023-12-24 DIAGNOSIS — E278 Other specified disorders of adrenal gland: Secondary | ICD-10-CM | POA: Diagnosis not present

## 2023-12-24 DIAGNOSIS — I4891 Unspecified atrial fibrillation: Secondary | ICD-10-CM | POA: Diagnosis not present

## 2023-12-24 DIAGNOSIS — F0390 Unspecified dementia without behavioral disturbance: Secondary | ICD-10-CM | POA: Diagnosis not present

## 2023-12-25 DIAGNOSIS — N390 Urinary tract infection, site not specified: Secondary | ICD-10-CM | POA: Diagnosis not present

## 2023-12-25 DIAGNOSIS — F0390 Unspecified dementia without behavioral disturbance: Secondary | ICD-10-CM | POA: Diagnosis not present

## 2023-12-25 DIAGNOSIS — R4182 Altered mental status, unspecified: Secondary | ICD-10-CM | POA: Diagnosis not present

## 2023-12-25 DIAGNOSIS — R7989 Other specified abnormal findings of blood chemistry: Secondary | ICD-10-CM | POA: Diagnosis not present

## 2023-12-25 DIAGNOSIS — I4891 Unspecified atrial fibrillation: Secondary | ICD-10-CM | POA: Diagnosis not present

## 2023-12-25 DIAGNOSIS — I272 Pulmonary hypertension, unspecified: Secondary | ICD-10-CM | POA: Diagnosis not present

## 2023-12-26 DIAGNOSIS — E039 Hypothyroidism, unspecified: Secondary | ICD-10-CM | POA: Diagnosis not present

## 2023-12-26 DIAGNOSIS — I4891 Unspecified atrial fibrillation: Secondary | ICD-10-CM | POA: Diagnosis not present

## 2023-12-26 DIAGNOSIS — I10 Essential (primary) hypertension: Secondary | ICD-10-CM | POA: Diagnosis not present

## 2023-12-26 DIAGNOSIS — I272 Pulmonary hypertension, unspecified: Secondary | ICD-10-CM | POA: Diagnosis not present

## 2023-12-26 DIAGNOSIS — N390 Urinary tract infection, site not specified: Secondary | ICD-10-CM | POA: Diagnosis not present

## 2023-12-26 DIAGNOSIS — R4182 Altered mental status, unspecified: Secondary | ICD-10-CM | POA: Diagnosis not present

## 2023-12-27 DIAGNOSIS — I4891 Unspecified atrial fibrillation: Secondary | ICD-10-CM | POA: Diagnosis not present

## 2023-12-27 DIAGNOSIS — R4182 Altered mental status, unspecified: Secondary | ICD-10-CM | POA: Diagnosis not present

## 2023-12-27 DIAGNOSIS — I10 Essential (primary) hypertension: Secondary | ICD-10-CM | POA: Diagnosis not present

## 2023-12-27 DIAGNOSIS — I2724 Chronic thromboembolic pulmonary hypertension: Secondary | ICD-10-CM | POA: Diagnosis not present

## 2023-12-27 DIAGNOSIS — E039 Hypothyroidism, unspecified: Secondary | ICD-10-CM | POA: Diagnosis not present

## 2023-12-27 DIAGNOSIS — N390 Urinary tract infection, site not specified: Secondary | ICD-10-CM | POA: Diagnosis not present

## 2023-12-27 DIAGNOSIS — I272 Pulmonary hypertension, unspecified: Secondary | ICD-10-CM | POA: Diagnosis not present

## 2023-12-27 DIAGNOSIS — G928 Other toxic encephalopathy: Secondary | ICD-10-CM | POA: Diagnosis not present

## 2023-12-27 LAB — CULTURE, BLOOD (ROUTINE X 2)
Culture: NO GROWTH
Culture: NO GROWTH

## 2023-12-28 DIAGNOSIS — R4182 Altered mental status, unspecified: Secondary | ICD-10-CM | POA: Diagnosis not present

## 2023-12-28 DIAGNOSIS — I2724 Chronic thromboembolic pulmonary hypertension: Secondary | ICD-10-CM | POA: Diagnosis not present

## 2023-12-28 DIAGNOSIS — N390 Urinary tract infection, site not specified: Secondary | ICD-10-CM | POA: Diagnosis not present

## 2023-12-28 DIAGNOSIS — G928 Other toxic encephalopathy: Secondary | ICD-10-CM | POA: Diagnosis not present

## 2023-12-28 DIAGNOSIS — E039 Hypothyroidism, unspecified: Secondary | ICD-10-CM | POA: Diagnosis not present

## 2023-12-28 DIAGNOSIS — I10 Essential (primary) hypertension: Secondary | ICD-10-CM | POA: Diagnosis not present

## 2023-12-28 DIAGNOSIS — I4891 Unspecified atrial fibrillation: Secondary | ICD-10-CM | POA: Diagnosis not present

## 2024-01-07 DIAGNOSIS — G309 Alzheimer's disease, unspecified: Secondary | ICD-10-CM | POA: Diagnosis not present

## 2024-01-07 DIAGNOSIS — Z87898 Personal history of other specified conditions: Secondary | ICD-10-CM | POA: Diagnosis not present

## 2024-01-07 DIAGNOSIS — R4182 Altered mental status, unspecified: Secondary | ICD-10-CM | POA: Diagnosis not present

## 2024-01-07 DIAGNOSIS — Z8679 Personal history of other diseases of the circulatory system: Secondary | ICD-10-CM | POA: Diagnosis not present

## 2024-01-07 DIAGNOSIS — Z0000 Encounter for general adult medical examination without abnormal findings: Secondary | ICD-10-CM | POA: Diagnosis not present

## 2024-01-09 ENCOUNTER — Inpatient Hospital Stay: Admitting: Oncology

## 2024-01-09 ENCOUNTER — Encounter: Payer: Self-pay | Admitting: Oncology

## 2024-01-09 ENCOUNTER — Inpatient Hospital Stay: Attending: Oncology

## 2024-01-09 VITALS — BP 150/60 | HR 55 | Temp 97.0°F | Resp 16 | Wt 140.8 lb

## 2024-01-09 DIAGNOSIS — J301 Allergic rhinitis due to pollen: Secondary | ICD-10-CM | POA: Diagnosis not present

## 2024-01-09 DIAGNOSIS — I272 Pulmonary hypertension, unspecified: Secondary | ICD-10-CM | POA: Diagnosis not present

## 2024-01-09 DIAGNOSIS — D472 Monoclonal gammopathy: Secondary | ICD-10-CM | POA: Diagnosis present

## 2024-01-09 DIAGNOSIS — J449 Chronic obstructive pulmonary disease, unspecified: Secondary | ICD-10-CM | POA: Diagnosis not present

## 2024-01-09 DIAGNOSIS — D72819 Decreased white blood cell count, unspecified: Secondary | ICD-10-CM

## 2024-01-09 DIAGNOSIS — I2782 Chronic pulmonary embolism: Secondary | ICD-10-CM | POA: Diagnosis not present

## 2024-01-09 DIAGNOSIS — D471 Chronic myeloproliferative disease: Secondary | ICD-10-CM | POA: Diagnosis not present

## 2024-01-09 LAB — CBC WITH DIFFERENTIAL (CANCER CENTER ONLY)
Abs Immature Granulocytes: 0.01 K/uL (ref 0.00–0.07)
Basophils Absolute: 0.2 K/uL — ABNORMAL HIGH (ref 0.0–0.1)
Basophils Relative: 4 %
Eosinophils Absolute: 0.2 K/uL (ref 0.0–0.5)
Eosinophils Relative: 4 %
HCT: 44.3 % (ref 36.0–46.0)
Hemoglobin: 14.3 g/dL (ref 12.0–15.0)
Immature Granulocytes: 0 %
Lymphocytes Relative: 24 %
Lymphs Abs: 0.9 K/uL (ref 0.7–4.0)
MCH: 27.6 pg (ref 26.0–34.0)
MCHC: 32.3 g/dL (ref 30.0–36.0)
MCV: 85.5 fL (ref 80.0–100.0)
Monocytes Absolute: 0.3 K/uL (ref 0.1–1.0)
Monocytes Relative: 9 %
Neutro Abs: 2.2 K/uL (ref 1.7–7.7)
Neutrophils Relative %: 59 %
Platelet Count: 639 K/uL — ABNORMAL HIGH (ref 150–400)
RBC: 5.18 MIL/uL — ABNORMAL HIGH (ref 3.87–5.11)
RDW: 16.8 % — ABNORMAL HIGH (ref 11.5–15.5)
WBC Count: 3.7 K/uL — ABNORMAL LOW (ref 4.0–10.5)
nRBC: 0 % (ref 0.0–0.2)

## 2024-01-09 LAB — CMP (CANCER CENTER ONLY)
ALT: 11 U/L (ref 0–44)
AST: 17 U/L (ref 15–41)
Albumin: 3.4 g/dL — ABNORMAL LOW (ref 3.5–5.0)
Alkaline Phosphatase: 60 U/L (ref 38–126)
Anion gap: 5 (ref 5–15)
BUN: 13 mg/dL (ref 8–23)
CO2: 22 mmol/L (ref 22–32)
Calcium: 11.1 mg/dL — ABNORMAL HIGH (ref 8.9–10.3)
Chloride: 111 mmol/L (ref 98–111)
Creatinine: 0.83 mg/dL (ref 0.44–1.00)
GFR, Estimated: >60 mL/min (ref >60)
Glucose, Bld: 98 mg/dL (ref 70–99)
Potassium: 3.3 mmol/L — ABNORMAL LOW (ref 3.5–5.1)
Sodium: 138 mmol/L (ref 135–145)
Total Bilirubin: 0.9 mg/dL (ref 0.0–1.2)
Total Protein: 7.1 g/dL (ref 6.5–8.1)

## 2024-01-09 NOTE — Progress Notes (Signed)
 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 Increased calcium level.  Today's MGUS labs are pending. Recommend observation.   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  Differential diagnosis include essential thrombocythemia, polycythemia vera vs myelofibrosis.  Patient declines bone marrow biopsy and also is not interested in any treatment.  Monitor   Hypercalcemia Chronic hypercalcemia. ? Due to hydrochlorothiazide.  Check PTH - pending.  Check skeletal survey   Orders Placed This Encounter  Procedures   CBC with Differential (Cancer Center Only)    Standing Status:   Future    Expected Date:   07/09/2024    Expiration Date:   10/07/2024   CMP (Cancer Center only)    Standing Status:   Future    Expected Date:   07/09/2024    Expiration Date:   10/07/2024   Multiple Myeloma Panel (SPEP&IFE w/QIG)    Standing Status:   Future    Expected Date:   07/09/2024    Expiration Date:   10/07/2024   Kappa/lambda light chains    Standing Status:   Future    Expected Date:   07/09/2024    Expiration Date:   10/07/2024   Follow-up 4 months.  All questions were answered. The patient knows to call the clinic with any problems, questions or concerns.  Zelphia Cap, MD, PhD [facility] Hematology Oncology 01/09/2024   HISTORY OF PRESENTING ILLNESS:   Danielle Hunt is a  83 y.o.  female with PMH listed below was seen in consultation at the request of  Alla Amis, MD  for evaluation of leukopenia.   She has experienced significant weight loss of approximately 50 pounds over the past year, although she is currently regaining some weight. No recent illnesses such as sinus infections, pneumonia, or urinary tract infections. No night sweats, blood in the stool, or low-grade fevers. Her appetite is described as 'pretty good,' and she feels hungry at the time of the visit.  07/03/2023, CBC showed white count 4, differential showed increased basophil percentage. Erythrocytosis with hemoglobin 15.2, platelet count 546,000.  INTERVAL HISTORY Danielle Hunt is a 83 y.o. 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  She has no new complaints. She presents to discuss results. Accompanied by daughter  INTERVAL HISTORY Danielle Hunt is a 83 y.o. female who has above history reviewed by me today presents for follow up visit for MGUS and   MEDICAL HISTORY:  Past Medical History:  Diagnosis Date   Hypercholesteremia    Hypertension     SURGICAL HISTORY: Past Surgical History:  Procedure Laterality Date   ABDOMINAL HYSTERECTOMY     TUMOR REMOVAL      SOCIAL HISTORY: Social History   Socioeconomic History   Marital status: Single    Spouse name: Not on file   Number of children: Not on file   Years of education: Not on file  Highest education level: Not on file  Occupational History   Not on file  Tobacco Use   Smoking status: Former   Smokeless tobacco: Never  Substance and Sexual Activity   Alcohol use: No   Drug use: No   Sexual activity: Not on file  Other Topics Concern   Not on file  Social History Narrative   Not on file   Social Drivers of Health   Financial Resource Strain: Low Risk  (12/25/2023)   Received from [facility]   Overall Financial Resource Strain (CARDIA)    How hard is it for you to pay for the very basics like  food, housing, medical care, and heating?: Not hard at all  Food Insecurity: No Food Insecurity (12/25/2023)   Received from [facility]   Hunger Vital Sign    Within the past 12 months, you worried that your food would run out before you got the money to buy more.: Never true    Within the past 12 months, the food you bought just didn't last and you didn't have money to get more.: Never true  Transportation Needs: No Transportation Needs (12/25/2023)   Received from [facility]   PRAPARE - Transportation    Lack of Transportation (Medical): No    Lack of Transportation (Non-Medical): No  Physical Activity: Not on file  Stress: Not on file  Social Connections: Not on file  Intimate Partner Violence: Not on file    FAMILY HISTORY: History reviewed. No pertinent family history.  ALLERGIES:  has no known allergies.  MEDICATIONS:  Current Outpatient Medications  Medication Sig Dispense Refill   amLODipine (NORVASC) 10 MG tablet Take 1 tablet by mouth daily.     aspirin EC 81 MG tablet Take 81 mg by mouth daily.     atorvastatin (LIPITOR) 40 MG tablet Take 40 mg by mouth daily.     Cholecalciferol (VITAMIN D-1000 MAX ST) 25 MCG (1000 UT) tablet Take 1,000 Units by mouth.     donepezil (ARICEPT) 5 MG tablet Take 1 tablet by mouth at bedtime.     dorzolamide-timolol (COSOPT) 2-0.5 % ophthalmic solution 1 drop 2 (two) times daily.     hydrALAZINE (APRESOLINE) 25 MG tablet 1 tab up to twice a prn if bp >160/100     hydrochlorothiazide (HYDRODIURIL) 25 MG tablet Take 25 mg by mouth daily.     levothyroxine (SYNTHROID) 25 MCG tablet Take 25 mcg by mouth.     losartan (COZAAR) 100 MG tablet Take 100 mg by mouth daily.     rosuvastatin (CRESTOR) 10 MG tablet Take 1 tablet by mouth at bedtime.     traMADol  (ULTRAM ) 50 MG tablet Take 1 tablet (50 mg total) by mouth every 8 (eight) hours as needed. (Patient not taking: Reported on 01/09/2024) 15 tablet 0   No current facility-administered  medications for this visit.    Review of Systems  Constitutional:  Positive for unexpected weight change. Negative for appetite change, chills, fatigue and fever.  HENT:   Negative for hearing loss and voice change.   Eyes:  Negative for eye problems.  Respiratory:  Negative for chest tightness and cough.   Cardiovascular:  Negative for chest pain.  Gastrointestinal:  Negative for abdominal distention, abdominal pain and blood in stool.  Endocrine: Negative for hot flashes.  Genitourinary:  Negative for difficulty urinating and frequency.   Musculoskeletal:  Negative for arthralgias.  Skin:  Negative for itching and rash.  Neurological:  Negative for extremity weakness.  Hematological:  Negative for adenopathy.  Psychiatric/Behavioral:  Negative for confusion.    PHYSICAL EXAMINATION: ECOG PERFORMANCE STATUS: 1 - Symptomatic but completely ambulatory Vitals:   01/09/24 1019  BP: (!) 150/60  Pulse: (!) 55  Resp: 16  Temp: (!) 97 F (36.1 C)  SpO2: 98%   Filed Weights   01/09/24 1019  Weight: 140 lb 12.8 oz (63.9 kg)    Physical Exam Constitutional:      General: She is not in acute distress. HENT:     Head: Normocephalic and atraumatic.  Eyes:     General: No scleral icterus. Cardiovascular:     Rate and Rhythm: Normal rate and regular rhythm.     Heart sounds: Normal heart sounds.  Pulmonary:     Effort: Pulmonary effort is normal. No respiratory distress.     Breath sounds: Normal breath sounds. No wheezing.  Abdominal:     General: Bowel sounds are normal. There is no distension.     Palpations: Abdomen is soft.  Musculoskeletal:        General: No deformity. Normal range of motion.     Cervical back: Normal range of motion and neck supple.  Skin:    General: Skin is warm and dry.     Findings: No erythema or rash.  Neurological:     Mental Status: She is alert and oriented to person, place, and time. Mental status is at baseline.  Psychiatric:        Mood  and Affect: Mood normal.     LABORATORY DATA:  I have reviewed the data as listed    Latest Ref Rng & Units 01/09/2024    9:57 AM 12/21/2023   11:09 PM 07/27/2023    1:08 PM  CBC  WBC 4.0 - 10.5 K/uL 3.7  5.6  3.9   Hemoglobin 12.0 - 15.0 g/dL 85.6  85.0  85.0   Hematocrit 36.0 - 46.0 % 44.3  45.7  46.3   Platelets 150 - 400 K/uL 639  385  506       Latest Ref Rng & Units 01/09/2024    9:57 AM 12/21/2023   11:09 PM 07/27/2023    1:08 PM  CMP  Glucose 70 - 99 mg/dL 98  863  899   BUN 8 - 23 mg/dL 13  17  9    Creatinine 0.44 - 1.00 mg/dL 9.16  8.95  9.16   Sodium 135 - 145 mmol/L 138  140  140   Potassium 3.5 - 5.1 mmol/L 3.3  3.1  3.3   Chloride 98 - 111 mmol/L 111  108  113   CO2 22 - 32 mmol/L 22  19  21    Calcium 8.9 - 10.3 mg/dL 88.8  89.1  89.0   Total Protein 6.5 - 8.1 g/dL 7.1   7.1   Total Bilirubin 0.0 - 1.2 mg/dL 0.9   1.2   Alkaline Phos 38 - 126 U/L 60   61   AST 15 - 41 U/L 17   14   ALT 0 - 44 U/L 11   9       RADIOGRAPHIC STUDIES: I have personally reviewed the radiological images as listed and agreed with the findings in the report. DG Chest Port 1 View Result Date: 12/22/2023 CLINICAL DATA:  Possible sepsis EXAM: PORTABLE CHEST 1 VIEW COMPARISON:  None Available. FINDINGS: Cardiac shadow is enlarged but accentuated by the portable technique. Tortuous thoracic aorta is noted  with calcification. The lungs are well aerated. Increased density is noted along the right paratracheal region suspicious for adenopathy. CT of the chest with contrast is recommended for further evaluation. No acute bony abnormality is noted. IMPRESSION: Increased soft tissue density in the right paratracheal region suspicious for adenopathy. CT of the chest is recommended for further evaluation. Electronically Signed   By: Oneil Devonshire M.D.   On: 12/22/2023 02:53

## 2024-01-09 NOTE — Assessment & Plan Note (Addendum)
 Chronic hypercalcemia. ? Due to hydrochlorothiazide.  Check PTH - pending.  Check skeletal survey

## 2024-01-09 NOTE — Assessment & Plan Note (Addendum)
 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 Increased calcium level.  Today's MGUS labs are pending. Recommend observation.

## 2024-01-09 NOTE — Assessment & Plan Note (Signed)
 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  Differential diagnosis include essential thrombocythemia, polycythemia vera vs myelofibrosis.  Patient declines bone marrow biopsy and also is not interested in any treatment.  Monitor

## 2024-01-10 DIAGNOSIS — Z9289 Personal history of other medical treatment: Secondary | ICD-10-CM | POA: Diagnosis not present

## 2024-01-10 DIAGNOSIS — F0150 Vascular dementia without behavioral disturbance: Secondary | ICD-10-CM | POA: Diagnosis not present

## 2024-01-10 DIAGNOSIS — D72819 Decreased white blood cell count, unspecified: Secondary | ICD-10-CM | POA: Diagnosis not present

## 2024-01-10 DIAGNOSIS — D75839 Thrombocytosis, unspecified: Secondary | ICD-10-CM | POA: Diagnosis not present

## 2024-01-10 DIAGNOSIS — E7800 Pure hypercholesterolemia, unspecified: Secondary | ICD-10-CM | POA: Diagnosis not present

## 2024-01-10 DIAGNOSIS — G309 Alzheimer's disease, unspecified: Secondary | ICD-10-CM | POA: Diagnosis not present

## 2024-01-10 DIAGNOSIS — I10 Essential (primary) hypertension: Secondary | ICD-10-CM | POA: Diagnosis not present

## 2024-01-10 DIAGNOSIS — F0280 Dementia in other diseases classified elsewhere without behavioral disturbance: Secondary | ICD-10-CM | POA: Diagnosis not present

## 2024-01-10 DIAGNOSIS — E876 Hypokalemia: Secondary | ICD-10-CM | POA: Diagnosis not present

## 2024-01-10 DIAGNOSIS — R7303 Prediabetes: Secondary | ICD-10-CM | POA: Diagnosis not present

## 2024-01-10 LAB — KAPPA/LAMBDA LIGHT CHAINS
Kappa free light chain: 86.5 mg/L — ABNORMAL HIGH (ref 3.3–19.4)
Kappa, lambda light chain ratio: 6.09 — ABNORMAL HIGH (ref 0.26–1.65)
Lambda free light chains: 14.2 mg/L (ref 5.7–26.3)

## 2024-01-10 LAB — PTH, INTACT AND CALCIUM
Calcium, Total (PTH): 11.6 mg/dL — ABNORMAL HIGH (ref 8.7–10.3)
PTH: 93 pg/mL — ABNORMAL HIGH (ref 15–65)

## 2024-01-10 NOTE — Progress Notes (Signed)
 Chief Complaint  Patient presents with  . Follow-up    Discuss FMLA paperwork Patient back on blood thinner    HPI  Danielle Hunt is a 83 y.o. female here for f/u of chronic medical issues and complete FMLA paperwork for her daughters  Borderline DM: No acute issues.  No meds.  Asymptomatic.  Not checking CBGs.  HLD: No acute issues.  Tolerating medications without adverse effects.  Denies any myalgia.  No cp or CVA symptoms.   HTN: No acute issues.  Tolerating meds without adverse effects.  Unable to recall home bp.  Denies HAs, vision changes, dizziness, cp, or syncopal episodes.  Dementia: Moderate status.  Needs help with ADLs and constant supervision provided by family.  Needs help with medication management.   ROS Review of systems is unremarkable for any active cardiac, respiratory, GI, GU, hematologic, neurologic, dermatologic, HEENT, or psychiatric symptoms except as noted above.  No fevers, chills, or constitutional symptoms.   Current Outpatient Medications  Medication Sig Dispense Refill  . amLODIPine (NORVASC) 10 MG tablet Take 1 tablet (10 mg total) by mouth once daily 100 tablet 1  . dabigatran (PRADAXA) 75 mg capsule Take 1 capsule (75 mg total) by mouth 2 (two) times daily for 90 days 60 capsule 1  . donepeziL (ARICEPT) 5 MG tablet TAKE 1 TABLET BY MOUTH AT BEDTIME 90 tablet 1  . dorzolamide HCl/timolol maleat (DORZOLAMIDE-TIMOLOL OPHTH) Apply 1 drop to eye once daily    . hydrALAZINE (APRESOLINE) 25 MG tablet 1 tab up to twice a prn if bp >160/100 60 tablet 3  . latanoprost (XALATAN) 0.005 % ophthalmic solution INSTILL 1 DROP INTO EACH EYE AT BEDTIME    . levothyroxine (SYNTHROID) 25 MCG tablet Take 25 mcg by mouth every morning before breakfast (0630)    . losartan (COZAAR) 100 MG tablet Take 1 tablet (100 mg total) by mouth once daily 100 tablet 1  . rosuvastatin (CRESTOR) 10 MG tablet Take 1 tablet (10 mg total) by mouth at bedtime 100 tablet 1  . spironolactone  (ALDACTONE) 50 MG tablet Take 1 tablet (50 mg total) by mouth once daily 100 tablet 1   No current facility-administered medications for this visit.    Allergies as of 01/10/2024  . (No Known Allergies)    Patient Active Problem List  Diagnosis  . History of hyperparathyroidism (Ca 11.7 - 08/16/22) - followed by Dr. Cherilyn  . Essential hypertension  . Pure hypercholesterolemia (LDL 75 - 01/07/24)  . Medicare annual wellness visit, initial: 12/09/13  . DNR (do not resuscitate)  . Medicare annual wellness visit, subsequent 01/02/23  . Seasonal allergies  . Osteopenia of neck of left femur (Dexa 11/03/20)  . Thrombocytosis (Plt 669 - 01/07/24)  . Mixed dementia (CMS-HCC)  . 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 Borderline diabetes mellitus (A1c 6% - 01/07/24) - no meds  . H/O ventilation/perfusion scan (suggestive of embolic etiology) on pradaxa - followed by Dr. Parris    Past Medical History:  Diagnosis Date  . Borderline diabetes mellitus   . Cataract cortical, senile    Bilateral  . Glaucoma (increased eye pressure)   . History of chicken pox   . History of hyperparathyroidism   . History of tobacco abuse    quit 2008 per patient  . Hyperlipidemia   . Hypertension   . LVH (left ventricular hypertrophy)    Diastolic dysfunction    echo-06/13/2007  . Migraines   . Obesity   .  Osteoarthritis     Past Surgical History:  Procedure Laterality Date  . APPENDECTOMY    . HYSTERECTOMY    . Left breast biopsy, 08/2007 ([facility])    . PARATHYROIDECTOMY     2001, 2003, 2004   ([facility])    Vitals:   01/10/24 1145  BP: 110/50  Pulse: 53  SpO2: 97%  Weight: 63.5 kg (140 lb)  Height: 152.4 cm (5')  PainSc: 0-No pain   Body mass index is 27.34 kg/m.  Exam  General. Well appearing pleasant demented elderly female accompanied by family; NAD; VS reviewed     Eyes. Sclera and conjunctiva clear; Vision grossly intact; extraocular movements intact Neck. Supple.  Lungs.  Respirations unlabored; clear to auscultation bilaterally Cardiovascular. Heart regular rate and rhythm without murmurs, gallops, or rubs Abdomen. Soft; non tender; non distended; no masses or organomegaly Extremities. no edema Skin. Normal color and turgor Neurologic. Alert; no new focal deficits  Assessment and Plan  1. Borderline diabetes mellitus (A1c 6% - 01/07/24) - no meds Stable off meds.  Focus on lifestyle modifications.  No need to check CBGs. -     Hemoglobin A1C; Future  2. Essential hypertension Well controlled.  Cr and electrolytes wnls except K 3.5 (12/2023).  No changes to current regimen.  Counseled on low salt diet and aerobic exercise 5x/wk.  Advised to monitor bp 2x/wk.  3. Pure hypercholesterolemia (LDL 75 - 01/07/24) Well controlled.  LFTs wnls (12/2023). Continue with current regimen.  Counseled on mediterranean diet and aerobic exercise. -     Comprehensive Metabolic Panel (CMP); Future  4. Mixed dementia (CMS-HCC) Stable. No changes to regimen.   5. Chronic leukopenia (WBC 3.8 - 01/07/24) Unchanged.  Asymptomatic.  Monitor for now.  -     Hemoglobin A1C; Future -     CBC w/auto Differential (5 Part); Future  6. Thrombocytosis (Plt 669 - 01/07/24) Chronic.  Asymptomatic.  Monitor for now.   -     Hemoglobin A1C; Future -     CBC w/auto Differential (5 Part); Future  7. Hypokalemia Chronic.  K 3.5 (01/07/24).  Can supplement w/ LMNT daily prn.  Asymptomatic.    8. H/O ventilation/perfusion scan (suggestive of embolic etiology) on pradaxa - followed by Dr. Parris Leer.  Results suggestive of embolic etiology.  Cont w/ pradaxa for now.  Has plans to f/u with cardiology.    Other orders -     Follow up in Primary Care -     Follow up in Primary Care; Future  FMLA paperwork completed and returned  F/u 6 months for reck; labs prior  ALDA CARPEN, MD

## 2024-01-12 ENCOUNTER — Ambulatory Visit: Payer: Self-pay | Admitting: Oncology

## 2024-01-12 LAB — MULTIPLE MYELOMA PANEL, SERUM
Albumin SerPl Elph-Mcnc: 3.2 g/dL (ref 2.9–4.4)
Albumin/Glob SerPl: 1 (ref 0.7–1.7)
Alpha 1: 0.3 g/dL (ref 0.0–0.4)
Alpha2 Glob SerPl Elph-Mcnc: 0.7 g/dL (ref 0.4–1.0)
B-Globulin SerPl Elph-Mcnc: 1.3 g/dL (ref 0.7–1.3)
Gamma Glob SerPl Elph-Mcnc: 1.1 g/dL (ref 0.4–1.8)
Globulin, Total: 3.5 g/dL (ref 2.2–3.9)
IgA: 546 mg/dL — ABNORMAL HIGH (ref 64–422)
IgG (Immunoglobin G), Serum: 1292 mg/dL (ref 586–1602)
IgM (Immunoglobulin M), Srm: 122 mg/dL (ref 26–217)
M Protein SerPl Elph-Mcnc: 0.5 g/dL — ABNORMAL HIGH
Total Protein ELP: 6.7 g/dL (ref 6.0–8.5)

## 2024-01-14 ENCOUNTER — Telehealth: Payer: Self-pay

## 2024-01-14 NOTE — Telephone Encounter (Signed)
 Ref faxed to [facility] endocrinology. Re: hyperparathyroidism. Fax confirmation received.

## 2024-01-14 NOTE — Progress Notes (Signed)
 Spoke to pt's daughter Lenward and informed her that Calcium is high and PTH is elevated. Informed her that referral was sent to [facility] endcrinology and resutls also forwarded to PCP. Annette verbalized understanding.

## 2024-01-17 ENCOUNTER — Ambulatory Visit: Admitting: Pulmonary Disease

## 2024-01-25 DIAGNOSIS — F0280 Dementia in other diseases classified elsewhere without behavioral disturbance: Secondary | ICD-10-CM | POA: Diagnosis not present

## 2024-01-25 DIAGNOSIS — I2724 Chronic thromboembolic pulmonary hypertension: Secondary | ICD-10-CM | POA: Diagnosis not present

## 2024-01-25 DIAGNOSIS — R0602 Shortness of breath: Secondary | ICD-10-CM | POA: Diagnosis not present

## 2024-01-25 DIAGNOSIS — Z8679 Personal history of other diseases of the circulatory system: Secondary | ICD-10-CM | POA: Diagnosis not present

## 2024-01-25 DIAGNOSIS — R7303 Prediabetes: Secondary | ICD-10-CM | POA: Diagnosis not present

## 2024-01-25 DIAGNOSIS — G309 Alzheimer's disease, unspecified: Secondary | ICD-10-CM | POA: Diagnosis not present

## 2024-01-25 DIAGNOSIS — I10 Essential (primary) hypertension: Secondary | ICD-10-CM | POA: Diagnosis not present

## 2024-01-25 DIAGNOSIS — I480 Paroxysmal atrial fibrillation: Secondary | ICD-10-CM | POA: Diagnosis not present

## 2024-01-25 DIAGNOSIS — F0150 Vascular dementia without behavioral disturbance: Secondary | ICD-10-CM | POA: Diagnosis not present

## 2024-02-08 NOTE — Telephone Encounter (Signed)
 Encounter opened in error.

## 2024-07-11 ENCOUNTER — Other Ambulatory Visit

## 2024-07-11 ENCOUNTER — Ambulatory Visit: Admitting: Oncology
